# Patient Record
Sex: Male | Born: 1950 | Race: White | Hispanic: No | Smoking: Former smoker
Health system: Southern US, Community
[De-identification: ages and names within clinical notes are randomized; demographics above are authoritative.]

## PROBLEM LIST (undated history)

## (undated) DIAGNOSIS — R918 Other nonspecific abnormal finding of lung field: Secondary | ICD-10-CM

## (undated) DIAGNOSIS — R042 Hemoptysis: Secondary | ICD-10-CM

## (undated) DIAGNOSIS — R220 Localized swelling, mass and lump, head: Secondary | ICD-10-CM

## (undated) DIAGNOSIS — Z7189 Other specified counseling: Secondary | ICD-10-CM

## (undated) DIAGNOSIS — L299 Pruritus, unspecified: Secondary | ICD-10-CM

## (undated) DIAGNOSIS — Z5112 Encounter for antineoplastic immunotherapy: Secondary | ICD-10-CM

## (undated) DIAGNOSIS — L0390 Cellulitis, unspecified: Secondary | ICD-10-CM

## (undated) DIAGNOSIS — R0600 Dyspnea, unspecified: Secondary | ICD-10-CM

## (undated) DIAGNOSIS — C099 Malignant neoplasm of tonsil, unspecified: Secondary | ICD-10-CM

## (undated) HISTORY — DX: Hemoptysis: R04.2

## (undated) HISTORY — DX: Encounter for antineoplastic immunotherapy: Z51.12

## (undated) HISTORY — DX: Other nonspecific abnormal finding of lung field: R91.8

## (undated) HISTORY — PX: COLON RESECTION: SHX5231

## (undated) HISTORY — DX: Malignant neoplasm of tonsil, unspecified: C09.9

## (undated) HISTORY — PX: ANKLE SURGERY: SHX546

## (undated) HISTORY — DX: Other specified counseling: Z71.89

## (undated) HISTORY — DX: Pruritus, unspecified: L29.9

## (undated) HISTORY — DX: Localized swelling, mass and lump, head: R22.0

---

## 2013-01-23 ENCOUNTER — Emergency Department (HOSPITAL_COMMUNITY)
Admission: EM | Admit: 2013-01-23 | Discharge: 2013-01-23 | Disposition: A | Discharge status: Home / Self Care | Payer: Self-pay | Attending: Emergency Medicine | Admitting: Emergency Medicine

## 2013-01-23 ENCOUNTER — Emergency Department (HOSPITAL_COMMUNITY): Payer: Self-pay

## 2013-01-23 ENCOUNTER — Encounter (HOSPITAL_COMMUNITY): Payer: Self-pay | Admitting: Family Medicine

## 2013-01-23 DIAGNOSIS — R0602 Shortness of breath: Secondary | ICD-10-CM | POA: Insufficient documentation

## 2013-01-23 DIAGNOSIS — R059 Cough, unspecified: Secondary | ICD-10-CM | POA: Insufficient documentation

## 2013-01-23 DIAGNOSIS — R110 Nausea: Secondary | ICD-10-CM | POA: Insufficient documentation

## 2013-01-23 DIAGNOSIS — F17200 Nicotine dependence, unspecified, uncomplicated: Secondary | ICD-10-CM | POA: Insufficient documentation

## 2013-01-23 DIAGNOSIS — R05 Cough: Secondary | ICD-10-CM | POA: Insufficient documentation

## 2013-01-23 DIAGNOSIS — Z7982 Long term (current) use of aspirin: Secondary | ICD-10-CM | POA: Insufficient documentation

## 2013-01-23 DIAGNOSIS — J40 Bronchitis, not specified as acute or chronic: Secondary | ICD-10-CM

## 2013-01-23 DIAGNOSIS — J209 Acute bronchitis, unspecified: Secondary | ICD-10-CM | POA: Insufficient documentation

## 2013-01-23 DIAGNOSIS — R509 Fever, unspecified: Secondary | ICD-10-CM | POA: Insufficient documentation

## 2013-01-23 DIAGNOSIS — R21 Rash and other nonspecific skin eruption: Secondary | ICD-10-CM | POA: Insufficient documentation

## 2013-01-23 DIAGNOSIS — R062 Wheezing: Secondary | ICD-10-CM | POA: Insufficient documentation

## 2013-01-23 MED ORDER — IPRATROPIUM BROMIDE 0.02 % IN SOLN
0.5000 mg | Freq: Once | RESPIRATORY_TRACT | Status: AC
Start: 2013-01-23 — End: 2013-01-23
  Administered 2013-01-23: 0.5 mg via RESPIRATORY_TRACT
  Filled 2013-01-23: qty 2.5

## 2013-01-23 MED ORDER — ALBUTEROL SULFATE (5 MG/ML) 0.5% IN NEBU
2.5000 mg | INHALATION_SOLUTION | Freq: Once | RESPIRATORY_TRACT | Status: AC
  Administered 2013-01-23: 2.5 mg via RESPIRATORY_TRACT
  Filled 2013-01-23: qty 0.5

## 2013-01-23 MED ORDER — BENZONATATE 100 MG PO CAPS: 100.0000 mg | ORAL_CAPSULE | Freq: Three times a day (TID) | ORAL | Status: DC

## 2013-01-23 MED ORDER — AZITHROMYCIN 250 MG PO TABS: ORAL_TABLET | ORAL | Status: DC

## 2013-01-23 MED ORDER — ALBUTEROL SULFATE HFA 108 (90 BASE) MCG/ACT IN AERS
2.0000 | INHALATION_SPRAY | Freq: Once | RESPIRATORY_TRACT | Status: AC
  Administered 2013-01-23: 2 via RESPIRATORY_TRACT
  Filled 2013-01-23: qty 6.7

## 2013-01-23 NOTE — ED Provider Notes (Signed)
History     CSN: [id number]  Arrival date & time 01/23/13  2132   First MD Initiated Contact with Patient 01/23/13 2204      Chief Complaint  Patient presents with  . Cough  . Shortness of Breath    (Consider location/radiation/quality/duration/timing/severity/associated sxs/prior treatment) HPI Comments: 62 y/o male with no significant PMHx presents to the ED with his wife and mother complaining of cough, wheezing and shortness of breath x3 days. Symptoms have been worsening and cough is productive with green phlegm. Admits to associated fever of 99.1 today which he took two aspirin for. A rash developed on both arms yesterday, he applied cortisone cream and took benadryl and has since been subsiding. Tried OTC cough syrup without relief of cough. Denies sick contacts. Admits to nausea when he has a severe "coughing spell". Denies chest pain, vomiting, urinary changes, confusion. He is a 1 ppd smoker.  Patient is a 63 y.o. male presenting with cough and shortness of breath. The history is provided by the patient, a relative and the spouse.  Cough Associated symptoms: fever, rash, shortness of breath and wheezing   Associated symptoms: no chest pain and no chills   Shortness of Breath Associated symptoms: cough, fever, rash and wheezing   Associated symptoms: no abdominal pain, no chest pain, no neck pain and no vomiting     History reviewed. No pertinent past medical history.  Past Surgical History  Procedure Laterality Date  . Abdominal surgery    . Ankle surgery      No family history on file.  History  Substance Use Topics  . Smoking status: Current Every Day Smoker -- 1.00 packs/day    Types: Cigarettes  . Smokeless tobacco: Not on file  . Alcohol Use: Yes     Comment: Occasionally      Review of Systems  Constitutional: Positive for fever. Negative for chills.  HENT: Negative for neck pain and neck stiffness.   Respiratory: Positive for cough, shortness of  breath and wheezing.   Cardiovascular: Negative for chest pain.  Gastrointestinal: Positive for nausea. Negative for vomiting and abdominal pain.  Skin: Positive for rash.  Psychiatric/Behavioral: Negative for confusion.  All other systems reviewed and are negative.    Allergies  Review of patient's allergies indicates no known allergies.  Home Medications  No current outpatient prescriptions on file.  BP 141/79  Pulse 76  Temp(Src) 98.1 F (36.7 C) (Oral)  Resp 16  Ht 6' (1.829 m)  Wt 190 lb (86.183 kg)  BMI 25.76 kg/m2  SpO2 96%  Physical Exam  Nursing note and vitals reviewed. Constitutional: He is oriented to person, place, and time. He appears well-developed and well-nourished. No distress.  Eyes: Conjunctivae and EOM are normal. Pupils are equal, round, and reactive to light.  Neck: Normal range of motion. Neck supple.  Cardiovascular: Normal rate, regular rhythm, normal heart sounds and intact distal pulses.   Pulmonary/Chest: Effort normal. No respiratory distress. He has wheezes (scattered expiratory). He has rhonchi (scattered, more prominent left lung fields).  Abdominal: Soft. Bowel sounds are normal. There is no tenderness.  Musculoskeletal: Normal range of motion. He exhibits no edema.  Lymphadenopathy:    He has no cervical adenopathy.  Neurological: He is alert and oriented to person, place, and time.  Skin: Rash noted. Rash is urticarial (scattered mildly on bilateral forearms). He is not diaphoretic.  Psychiatric: He has a normal mood and affect. His behavior is normal.    ED Course  Procedures (including critical care time)  Labs Reviewed - No data to display Dg Chest 2 View  01/23/2013  *RADIOLOGY REPORT*  Clinical Data: Cough, shortness of breath  CHEST - 2 VIEW  Comparison: None.  Findings: Cardiomediastinal silhouette appears normal.  No acute pulmonary disease is noted.  Bony thorax is intact.  IMPRESSION: No acute cardiopulmonary abnormality seen.    Original Report Authenticated By: Lupita Raider.,  M.D.      1. Bronchitis   2. Cough       MDM  62 y/o male with bronchitis. CXR without any acute process. No pneumonia. Slight improvement in symptoms with duonebs. Vitals stable. Afebrile. Will discharge with azithromycin, albuterol inhaler, tessalon. Return precautions discussed. Patient states understanding of plan and is agreeable. Resource guide given for PCP follow up.        Trevor Mace, PA-C 01/23/13 2323

## 2013-01-23 NOTE — ED Notes (Addendum)
Patient states that he has had cough and shortness of breath since Friday. States symptoms have gotten worse. Has been using OTC ASA, Benadryl, and cough medicine. Has been running a low grade fever at home. Reports coughing up green sputum.

## 2013-01-23 NOTE — ED Provider Notes (Signed)
Medical screening examination/treatment/procedure(s) were performed by non-physician practitioner and as supervising physician I was immediately available for consultation/collaboration.   Nelia Shi, MD 01/23/13 [phone]

## 2016-11-28 ENCOUNTER — Emergency Department (HOSPITAL_COMMUNITY): Payer: PPO

## 2016-11-28 ENCOUNTER — Encounter (HOSPITAL_COMMUNITY): Payer: Self-pay | Admitting: Oncology

## 2016-11-28 ENCOUNTER — Emergency Department (HOSPITAL_COMMUNITY)
Admission: EM | Admit: 2016-11-28 | Discharge: 2016-11-29 | Disposition: A | Discharge status: Home / Self Care | Payer: PPO | Attending: Emergency Medicine | Admitting: Emergency Medicine

## 2016-11-28 DIAGNOSIS — R079 Chest pain, unspecified: Secondary | ICD-10-CM | POA: Insufficient documentation

## 2016-11-28 DIAGNOSIS — J90 Pleural effusion, not elsewhere classified: Secondary | ICD-10-CM | POA: Diagnosis not present

## 2016-11-28 DIAGNOSIS — Z7982 Long term (current) use of aspirin: Secondary | ICD-10-CM | POA: Insufficient documentation

## 2016-11-28 DIAGNOSIS — Z5321 Procedure and treatment not carried out due to patient leaving prior to being seen by health care provider: Secondary | ICD-10-CM | POA: Insufficient documentation

## 2016-11-28 DIAGNOSIS — F17210 Nicotine dependence, cigarettes, uncomplicated: Secondary | ICD-10-CM | POA: Diagnosis not present

## 2016-11-28 LAB — CBC
HEMATOCRIT: 37.3 % — AB (ref 39.0–52.0)
HEMOGLOBIN: 12.5 g/dL — AB (ref 13.0–17.0)
MCH: 29.6 pg (ref 26.0–34.0)
MCHC: 33.5 g/dL (ref 30.0–36.0)
MCV: 88.2 fL (ref 78.0–100.0)
Platelets: 331 10*3/uL (ref 150–400)
RBC: 4.23 MIL/uL (ref 4.22–5.81)
RDW: 13.2 % (ref 11.5–15.5)
WBC: 12.4 10*3/uL — AB (ref 4.0–10.5)

## 2016-11-28 LAB — BASIC METABOLIC PANEL
ANION GAP: 8 (ref 5–15)
BUN: 14 mg/dL (ref 6–20)
CALCIUM: 8.9 mg/dL (ref 8.9–10.3)
CO2: 26 mmol/L (ref 22–32)
Chloride: 101 mmol/L (ref 101–111)
Creatinine, Ser: 1.12 mg/dL (ref 0.61–1.24)
GFR calc Af Amer: >60 mL/min (ref >60)
Glucose, Bld: 105 mg/dL — ABNORMAL HIGH (ref 65–99)
POTASSIUM: 4.1 mmol/L (ref 3.5–5.1)
SODIUM: 135 mmol/L (ref 135–145)

## 2016-11-28 LAB — I-STAT TROPONIN, ED: TROPONIN I, POC: 0 ng/mL (ref 0.00–0.08)

## 2016-11-28 NOTE — ED Notes (Signed)
Called for room w/o answer.  Will call again.

## 2016-11-28 NOTE — ED Triage Notes (Signed)
Per pt he developed left arm pain x 2 days ago.  Today pt developed left sided chest pain that he rates 8/10.  Pt also reports that he has been waking up in the middle of the night w/ palpitations.  +lightheaded.

## 2016-11-29 NOTE — ED Notes (Signed)
No answer for blood draw @ this time. 

## 2016-11-29 NOTE — ED Notes (Signed)
Pt is not in lobby.  Will d/c LWBS.

## 2016-12-15 ENCOUNTER — Telehealth: Payer: Self-pay | Admitting: Family

## 2016-12-15 NOTE — Telephone Encounter (Signed)
If he is coughing up blood then he needs to be seen in the ED. Per review of his chart, he went to the ED but as never evaluated.

## 2016-12-15 NOTE — Telephone Encounter (Signed)
Spoke with Marzetta Board, she is aware of Greg response. Go to urgent care symptoms worse but we are completely book for new pt. Offer an appt with Baldo Ash for new pt tomorrow.

## 2016-12-15 NOTE — Telephone Encounter (Signed)
Pt's spouse called request to be work in sooner for new pt due to coughing blood. Pt went to the ED already about this but hopping to get in before 12/24/2016. Please advise.

## 2016-12-16 DIAGNOSIS — E559 Vitamin D deficiency, unspecified: Secondary | ICD-10-CM | POA: Diagnosis not present

## 2016-12-16 DIAGNOSIS — R079 Chest pain, unspecified: Secondary | ICD-10-CM | POA: Diagnosis not present

## 2016-12-16 DIAGNOSIS — Z1389 Encounter for screening for other disorder: Secondary | ICD-10-CM | POA: Diagnosis not present

## 2016-12-16 DIAGNOSIS — R918 Other nonspecific abnormal finding of lung field: Secondary | ICD-10-CM | POA: Diagnosis not present

## 2016-12-16 DIAGNOSIS — Z114 Encounter for screening for human immunodeficiency virus [HIV]: Secondary | ICD-10-CM | POA: Diagnosis not present

## 2016-12-16 DIAGNOSIS — R0602 Shortness of breath: Secondary | ICD-10-CM | POA: Diagnosis not present

## 2016-12-16 DIAGNOSIS — R221 Localized swelling, mass and lump, neck: Secondary | ICD-10-CM | POA: Diagnosis not present

## 2016-12-16 DIAGNOSIS — Z0000 Encounter for general adult medical examination without abnormal findings: Secondary | ICD-10-CM | POA: Diagnosis not present

## 2016-12-16 DIAGNOSIS — Z13220 Encounter for screening for lipoid disorders: Secondary | ICD-10-CM | POA: Diagnosis not present

## 2016-12-16 DIAGNOSIS — R5383 Other fatigue: Secondary | ICD-10-CM | POA: Diagnosis not present

## 2016-12-16 DIAGNOSIS — Z23 Encounter for immunization: Secondary | ICD-10-CM | POA: Diagnosis not present

## 2016-12-17 DIAGNOSIS — R079 Chest pain, unspecified: Secondary | ICD-10-CM | POA: Diagnosis not present

## 2016-12-22 DIAGNOSIS — R0602 Shortness of breath: Secondary | ICD-10-CM | POA: Diagnosis not present

## 2016-12-22 DIAGNOSIS — M79606 Pain in leg, unspecified: Secondary | ICD-10-CM | POA: Diagnosis not present

## 2016-12-22 DIAGNOSIS — R0989 Other specified symptoms and signs involving the circulatory and respiratory systems: Secondary | ICD-10-CM | POA: Diagnosis not present

## 2016-12-22 DIAGNOSIS — R079 Chest pain, unspecified: Secondary | ICD-10-CM | POA: Diagnosis not present

## 2016-12-23 ENCOUNTER — Ambulatory Visit: Payer: PPO | Admitting: Family

## 2016-12-24 ENCOUNTER — Ambulatory Visit: Payer: PPO | Admitting: Family

## 2017-01-01 DIAGNOSIS — R918 Other nonspecific abnormal finding of lung field: Secondary | ICD-10-CM | POA: Diagnosis not present

## 2017-01-01 DIAGNOSIS — Z87891 Personal history of nicotine dependence: Secondary | ICD-10-CM | POA: Diagnosis not present

## 2017-01-01 DIAGNOSIS — R042 Hemoptysis: Secondary | ICD-10-CM | POA: Diagnosis not present

## 2017-01-02 ENCOUNTER — Other Ambulatory Visit: Payer: Self-pay | Admitting: Family

## 2017-01-02 DIAGNOSIS — Z87891 Personal history of nicotine dependence: Secondary | ICD-10-CM

## 2017-01-02 DIAGNOSIS — R918 Other nonspecific abnormal finding of lung field: Secondary | ICD-10-CM

## 2017-01-02 DIAGNOSIS — R042 Hemoptysis: Secondary | ICD-10-CM

## 2017-01-06 ENCOUNTER — Ambulatory Visit: Payer: PPO | Admitting: Family Medicine

## 2017-01-06 DIAGNOSIS — R918 Other nonspecific abnormal finding of lung field: Secondary | ICD-10-CM | POA: Diagnosis not present

## 2017-01-06 DIAGNOSIS — R590 Localized enlarged lymph nodes: Secondary | ICD-10-CM | POA: Diagnosis not present

## 2017-01-09 ENCOUNTER — Other Ambulatory Visit: Payer: PPO

## 2017-01-12 ENCOUNTER — Telehealth: Payer: Self-pay | Admitting: *Deleted

## 2017-01-12 DIAGNOSIS — R918 Other nonspecific abnormal finding of lung field: Secondary | ICD-10-CM

## 2017-01-12 NOTE — Telephone Encounter (Signed)
Oncology Nurse Navigator Documentation  Oncology Nurse Navigator Flowsheets 01/12/2017  Navigator Location [facility]  Referral date to RadOnc/MedOnc 01/12/2017  Navigator Encounter Type Telephone/Ms. Gorder called triage here at cancer center.  That message was forward to me.  I looked up Lawrence Weaver's information and was unaware of referral.  I updated Dr. Julien Nordmann on patient.  He states he can see patient Wednesday 01/14/17 at 2pm.  I called and spoke to him and his wife regarding appt.  She was thankful for the call.   Telephone Outgoing Call  Abnormal Finding Date 11/28/2016  Treatment Phase Abnormal Scans  Barriers/Navigation Needs Coordination of Care  Interventions Coordination of Care  Coordination of Care Appts  Acuity Level 2  Acuity Level 2 Other;Assistance expediting appointments  Time Spent with Patient 30

## 2017-01-14 ENCOUNTER — Other Ambulatory Visit (HOSPITAL_BASED_OUTPATIENT_CLINIC_OR_DEPARTMENT_OTHER): Payer: PPO

## 2017-01-14 ENCOUNTER — Encounter: Payer: Self-pay | Admitting: Internal Medicine

## 2017-01-14 ENCOUNTER — Ambulatory Visit (HOSPITAL_BASED_OUTPATIENT_CLINIC_OR_DEPARTMENT_OTHER): Payer: PPO | Admitting: Internal Medicine

## 2017-01-14 ENCOUNTER — Encounter: Payer: Self-pay | Admitting: *Deleted

## 2017-01-14 VITALS — BP 121/72 | HR 88 | Temp 97.8°F | Resp 18 | Ht 72.0 in | Wt 187.4 lb

## 2017-01-14 DIAGNOSIS — R918 Other nonspecific abnormal finding of lung field: Secondary | ICD-10-CM

## 2017-01-14 DIAGNOSIS — F1011 Alcohol abuse, in remission: Secondary | ICD-10-CM

## 2017-01-14 DIAGNOSIS — Z809 Family history of malignant neoplasm, unspecified: Secondary | ICD-10-CM

## 2017-01-14 DIAGNOSIS — R911 Solitary pulmonary nodule: Secondary | ICD-10-CM

## 2017-01-14 DIAGNOSIS — Z87891 Personal history of nicotine dependence: Secondary | ICD-10-CM

## 2017-01-14 DIAGNOSIS — R599 Enlarged lymph nodes, unspecified: Secondary | ICD-10-CM

## 2017-01-14 LAB — CBC WITH DIFFERENTIAL/PLATELET
BASO%: 1.2 % (ref 0.0–2.0)
BASOS ABS: 0.2 10*3/uL — AB (ref 0.0–0.1)
EOS%: 2.3 % (ref 0.0–7.0)
Eosinophils Absolute: 0.4 10*3/uL (ref 0.0–0.5)
HCT: 37.9 % — ABNORMAL LOW (ref 38.4–49.9)
HEMOGLOBIN: 12.3 g/dL — AB (ref 13.0–17.1)
LYMPH%: 13 % — ABNORMAL LOW (ref 14.0–49.0)
MCH: 28.1 pg (ref 27.2–33.4)
MCHC: 32.3 g/dL (ref 32.0–36.0)
MCV: 86.8 fL (ref 79.3–98.0)
MONO#: 1.2 10*3/uL — ABNORMAL HIGH (ref 0.1–0.9)
MONO%: 6.5 % (ref 0.0–14.0)
NEUT#: 14.2 10*3/uL — ABNORMAL HIGH (ref 1.5–6.5)
NEUT%: 77 % — ABNORMAL HIGH (ref 39.0–75.0)
Platelets: 440 10*3/uL — ABNORMAL HIGH (ref 140–400)
RBC: 4.37 10*6/uL (ref 4.20–5.82)
RDW: 13.9 % (ref 11.0–14.6)
WBC: 18.5 10*3/uL — ABNORMAL HIGH (ref 4.0–10.3)
lymph#: 2.4 10*3/uL (ref 0.9–3.3)

## 2017-01-14 LAB — COMPREHENSIVE METABOLIC PANEL
ALBUMIN: 2.9 g/dL — AB (ref 3.5–5.0)
ALT: 21 U/L (ref 0–55)
AST: 19 U/L (ref 5–34)
Alkaline Phosphatase: 98 U/L (ref 40–150)
Anion Gap: 8 mEq/L (ref 3–11)
BUN: 16.9 mg/dL (ref 7.0–26.0)
CHLORIDE: 103 meq/L (ref 98–109)
CO2: 25 mEq/L (ref 22–29)
Calcium: 9.6 mg/dL (ref 8.4–10.4)
Creatinine: 1.1 mg/dL (ref 0.7–1.3)
EGFR: 73 mL/min/{1.73_m2} — ABNORMAL LOW (ref >90)
GLUCOSE: 101 mg/dL (ref 70–140)
Potassium: 4.5 mEq/L (ref 3.5–5.1)
SODIUM: 137 meq/L (ref 136–145)
Total Bilirubin: 0.32 mg/dL (ref 0.20–1.20)
Total Protein: 7.5 g/dL (ref 6.4–8.3)

## 2017-01-14 NOTE — Progress Notes (Signed)
Oncology Nurse Navigator Documentation  Oncology Nurse Navigator Flowsheets 01/14/2017  Navigator Location [facility]  Navigator Encounter Type Clinic/MDC/spoke with patient and wife today at [facility].  Per Dr. Julien Nordmann, I took his CD to the radiology dept to have it scanned in to PACS.  Dr. Julien Nordmann would like patient to be seen by t surgery to be evaluated for tissue DX.  I will updated Dr. Leonarda Salon office.    Patient Visit Type MedOnc  Treatment Phase Abnormal Scans  Barriers/Navigation Needs Coordination of Care  Interventions Coordination of Care  Coordination of Care Other  Acuity Level 3  Time Spent with Patient 60
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
## 2017-01-15 ENCOUNTER — Inpatient Hospital Stay
Admission: RE | Admit: 2017-01-15 | Discharge: 2017-01-15 | Disposition: A | Discharge status: Home / Self Care | Payer: Self-pay | Source: Ambulatory Visit | Attending: Internal Medicine | Admitting: Internal Medicine

## 2017-01-15 ENCOUNTER — Other Ambulatory Visit: Payer: Self-pay | Admitting: *Deleted

## 2017-01-15 ENCOUNTER — Other Ambulatory Visit: Payer: Self-pay | Admitting: Internal Medicine

## 2017-01-15 DIAGNOSIS — C801 Malignant (primary) neoplasm, unspecified: Secondary | ICD-10-CM

## 2017-01-16 ENCOUNTER — Telehealth: Payer: Self-pay | Admitting: Internal Medicine

## 2017-01-16 NOTE — Telephone Encounter (Signed)
Per 01/14/2017 los. Message sent to Norton Blizzard for f/u in [facility].

## 2017-01-19 ENCOUNTER — Institutional Professional Consult (permissible substitution) (INDEPENDENT_AMBULATORY_CARE_PROVIDER_SITE_OTHER): Payer: Medicare HMO | Admitting: Thoracic Surgery (Cardiothoracic Vascular Surgery)

## 2017-01-19 ENCOUNTER — Other Ambulatory Visit: Payer: Self-pay | Admitting: *Deleted

## 2017-01-19 ENCOUNTER — Telehealth: Payer: Self-pay | Admitting: *Deleted

## 2017-01-19 ENCOUNTER — Encounter: Payer: Self-pay | Admitting: Thoracic Surgery (Cardiothoracic Vascular Surgery)

## 2017-01-19 VITALS — BP 106/61 | HR 80 | Resp 20 | Ht 72.0 in | Wt 187.0 lb

## 2017-01-19 DIAGNOSIS — R918 Other nonspecific abnormal finding of lung field: Secondary | ICD-10-CM | POA: Diagnosis not present

## 2017-01-19 DIAGNOSIS — Z720 Tobacco use: Secondary | ICD-10-CM | POA: Diagnosis not present

## 2017-01-19 NOTE — Telephone Encounter (Signed)
Oncology Nurse Navigator Documentation  Oncology Nurse Navigator Flowsheets 01/14/2017  Navigator Location [facility]  Navigator Encounter Type Clinic/MDC/I received referral on Lawrence Weaver.  I updated Dr. Julien Nordmann. I called patient with an appt on 01/23/17 labs at 8:45 and to see Dr. Julien Nordmann at 9:15.  Patient gave wife the phone regarding appt. She verbalized understanding of appt time and place.   Patient Visit Type MedOnc  Treatment Phase Abnormal Scans  Barriers/Navigation Needs Coordination of Care  Interventions Coordination of Care  Coordination of Care Other  Acuity Level 3  Time Spent with Patient 60

## 2017-01-19 NOTE — Progress Notes (Signed)
PCP is Eloise Levels, NP Referring Provider is Curt Bears M.D.  Chief Complaint  Patient presents with  . Lung Mass    Surgical eval for Tissue BX, CT scan' in PACS 01/06/17, PET Scan and MR Brain pending 01/21/17    HPI: Lawrence Weaver is a 66 year old man with a left upper lobe mass sent for consultation.  Lawrence Weaver is a 66 year old man with a long history of tobacco abuse (2 packs per day for about 50 years). He he denies any history of COPD, hypertension, hyperlipidemia, heart disease. Back in early February he developed left-sided chest pain. He went to the emergency room 1 evening chest x-ray was done which showed a left upper lobe mass. He says it was very crowded and he wasn't getting any tension so he left the emergency room. He later saw the report of the x-ray in My chart. He went to [facility] who did a CT of the chest, which revealed a 6.5 x 8 cm mass in the left upper lobe there also was a 17 mm spiculated mass in the right upper lobe. There was some mild adenopathy.  He describes his chest pain is been localized to the left side. It is not exertional. It sometimes feels severe. It does wax and wane. It does hurt with coughing and taking a deep breath. He has had a cough Dr. both mucus and small amounts of blood. He also complains of some dizzy spells. He does get shortness of breath with exertion. His appetite is poor. He has not had any significant weight loss.  Zubrod Score: At the time of surgery this patient's most appropriate activity status/level should be described as: '[x]'$     0    Normal activity, no symptoms '[]'$     1    Restricted in physical strenuous activity but ambulatory, able to do out light work '[]'$     2    Ambulatory and capable of self care, unable to do work activities, up and about >50 % of waking hours                              '[]'$     3    Only limited self care, in bed greater than 50% of waking hours '[]'$     4    Completely disabled, no self care, confined  to bed or chair '[]'$     5    Moribund   Past Medical History:  Diagnosis Date  . Mass of lung     Past Surgical History:  Procedure Laterality Date  . ABDOMINAL SURGERY    . ANKLE SURGERY      No family history on file.  Social History Social History  Substance Use Topics  . Smoking status: Former Smoker    Packs/day: 1.00    Types: Cigarettes    Quit date: 11/16/2016  . Smokeless tobacco: Never Used  . Alcohol use Yes     Comment: Occasionally    Current Outpatient Prescriptions  Medication Sig Dispense Refill  . aspirin 325 MG tablet Take 81 mg by mouth every 6 (six) hours as needed for fever.      No current facility-administered medications for this visit.     No Known Allergies  Review of Systems  Constitutional: Positive for appetite change and fatigue. Negative for unexpected weight change.  HENT: Negative for trouble swallowing and voice change.   Eyes: Positive for visual disturbance.  Respiratory: Positive for cough and shortness of breath.   Cardiovascular: Positive for chest pain.  Gastrointestinal: Negative for abdominal pain and blood in stool.  Genitourinary: Negative for difficulty urinating and dysuria.  Musculoskeletal: Negative for arthralgias and back pain.  Neurological: Positive for dizziness. Negative for seizures and syncope.  Hematological: Negative for adenopathy. Does not bruise/bleed easily.    BP 106/61   Pulse 80   Resp 20   Ht 6' (1.829 m)   Wt 187 lb (84.8 kg)   SpO2 96% Comment: RA  BMI 25.36 kg/m  Physical Exam  Constitutional: He is oriented to person, place, and time. He appears well-developed and well-nourished. No distress.  HENT:  Head: Normocephalic and atraumatic.  Neck: Neck supple. No thyromegaly present.  Subcutaneous nodule left neck  Cardiovascular: Normal rate, regular rhythm and normal heart sounds.   Pulmonary/Chest: He has no wheezes. He has no rales.  Diminished breath sounds left upper lobe   Abdominal: Soft. He exhibits no distension. There is no tenderness.  Musculoskeletal: He exhibits no edema.  Lymphadenopathy:    He has no cervical adenopathy.  Neurological: He is alert and oriented to person, place, and time. No cranial nerve deficit.  Skin: Skin is warm and dry.  Vitals reviewed.    Diagnostic Tests: I personally reviewed the CT images from [facility].  It shows a large left upper lobe mass occupying the vast majority of the left upper lobe. There is some postobstructive pneumonitis. There is a 17 mm spiculated nodule in the right upper lobe. There are some enlarged left hilar nodes.  Impression: Lawrence Weaver is a 66 year old man with a 100-pack-year history of tobacco abuse who presents with pleuritic left-sided chest pain and shortness of breath and hemoptysis. Workup has revealed a large left upper lobe mass as well as a smaller right upper lobe nodule. This most likely is stage IV lung cancer. He needs a biopsy to definitively determine the diagnosis and guide therapy.  I recommended bronchoscopy under general anesthesia to allow adequate sampling. This would be done in the operating room on an outpatient basis. I discussed the general nature of the procedure with his wife. They understand that this endoscopic. We discussed the indications, risks, benefits, and alternatives. They understand this is diagnostic not therapeutic. They understand the risks include, but are not limited to death, MI, stroke, DVT, PE, bleeding, pneumothorax, and failure to make a diagnosis.  He wishes to proceed.  Plan: Bronchoscopy on Thursday for 5 2018.  [name], MD [facility] [phone]

## 2017-01-20 ENCOUNTER — Encounter (HOSPITAL_COMMUNITY): Payer: Self-pay | Admitting: *Deleted

## 2017-01-20 ENCOUNTER — Other Ambulatory Visit: Payer: PPO

## 2017-01-21 ENCOUNTER — Encounter (HOSPITAL_COMMUNITY): Payer: Self-pay

## 2017-01-21 ENCOUNTER — Encounter (HOSPITAL_COMMUNITY)
Admission: RE | Admit: 2017-01-21 | Discharge: 2017-01-21 | Disposition: A | Discharge status: Home / Self Care | Payer: Medicare HMO | Source: Ambulatory Visit | Attending: Internal Medicine | Admitting: Internal Medicine

## 2017-01-21 ENCOUNTER — Ambulatory Visit (HOSPITAL_COMMUNITY)
Admission: RE | Admit: 2017-01-21 | Discharge: 2017-01-21 | Disposition: A | Discharge status: Home / Self Care | Payer: Medicare HMO | Source: Ambulatory Visit | Attending: Internal Medicine | Admitting: Internal Medicine

## 2017-01-21 DIAGNOSIS — R918 Other nonspecific abnormal finding of lung field: Secondary | ICD-10-CM | POA: Diagnosis not present

## 2017-01-21 DIAGNOSIS — R930 Abnormal findings on diagnostic imaging of skull and head, not elsewhere classified: Secondary | ICD-10-CM | POA: Diagnosis not present

## 2017-01-21 DIAGNOSIS — R911 Solitary pulmonary nodule: Secondary | ICD-10-CM | POA: Diagnosis not present

## 2017-01-21 DIAGNOSIS — J189 Pneumonia, unspecified organism: Secondary | ICD-10-CM | POA: Diagnosis not present

## 2017-01-21 DIAGNOSIS — J90 Pleural effusion, not elsewhere classified: Secondary | ICD-10-CM | POA: Insufficient documentation

## 2017-01-21 DIAGNOSIS — M799 Soft tissue disorder, unspecified: Secondary | ICD-10-CM | POA: Diagnosis not present

## 2017-01-21 LAB — GLUCOSE, CAPILLARY: GLUCOSE-CAPILLARY: 102 mg/dL — AB (ref 65–99)

## 2017-01-21 MED ORDER — FLUDEOXYGLUCOSE F - 18 (FDG) INJECTION
9.7100 | Freq: Once | INTRAVENOUS | Status: AC | PRN
  Administered 2017-01-21: 9.71 via INTRAVENOUS

## 2017-01-21 MED ORDER — IOPAMIDOL (ISOVUE-300) INJECTION 61%
INTRAVENOUS | Status: AC
  Administered 2017-01-21: 75 mL
  Filled 2017-01-21: qty 75

## 2017-01-21 NOTE — Anesthesia Preprocedure Evaluation (Addendum)
Anesthesia Evaluation  Patient identified by MRN, date of birth, ID band Patient awake    Reviewed: Allergy & Precautions, NPO status , Patient's Chart, lab work & pertinent test results  Airway Mallampati: II  TM Distance: >3 FB Neck ROM: Full    Dental  (+) Dental Advisory Given, Missing   Pulmonary shortness of breath and with exertion, former smoker,  LUL mass   Pulmonary exam normal breath sounds clear to auscultation       Cardiovascular Exercise Tolerance: Good negative cardio ROS Normal cardiovascular exam Rhythm:Regular Rate:Normal     Neuro/Psych negative neurological ROS  negative psych ROS   GI/Hepatic negative GI ROS, Neg liver ROS,   Endo/Other  negative endocrine ROS  Renal/GU negative Renal ROS     Musculoskeletal negative musculoskeletal ROS (+)   Abdominal   Peds  Hematology  (+) Blood dyscrasia, anemia ,   Anesthesia Other Findings Day of surgery medications reviewed with the patient.  Reproductive/Obstetrics                           Anesthesia Physical Anesthesia Plan  ASA: III  Anesthesia Plan: General   Post-op Pain Management:    Induction: Intravenous  Airway Management Planned: Oral ETT  Additional Equipment:   Intra-op Plan:   Post-operative Plan: Extubation in OR  Informed Consent: I have reviewed the patients History and Physical, chart, labs and discussed the procedure including the risks, benefits and alternatives for the proposed anesthesia with the patient or authorized representative who has indicated his/her understanding and acceptance.   Dental advisory given  Plan Discussed with: CRNA and Anesthesiologist  Anesthesia Plan Comments: (Risks/benefits of general anesthesia discussed with patient including risk of damage to teeth, lips, gum, and tongue, nausea/vomiting, allergic reactions to medications, and the possibility of heart attack,  stroke and death.  All patient questions answered.  Patient wishes to proceed.)       Anesthesia Quick Evaluation

## 2017-01-22 ENCOUNTER — Ambulatory Visit (HOSPITAL_COMMUNITY): Payer: Medicare HMO | Admitting: Anesthesiology

## 2017-01-22 ENCOUNTER — Ambulatory Visit (HOSPITAL_COMMUNITY)
Admission: RE | Admit: 2017-01-22 | Discharge: 2017-01-22 | Disposition: A | Discharge status: Home / Self Care | Payer: Medicare HMO | Source: Ambulatory Visit | Attending: Thoracic Surgery (Cardiothoracic Vascular Surgery) | Admitting: Thoracic Surgery (Cardiothoracic Vascular Surgery)

## 2017-01-22 ENCOUNTER — Encounter (HOSPITAL_COMMUNITY)
Admission: RE | Disposition: A | Discharge status: Home / Self Care | Payer: Self-pay | Source: Ambulatory Visit | Attending: Thoracic Surgery (Cardiothoracic Vascular Surgery)

## 2017-01-22 ENCOUNTER — Ambulatory Visit (HOSPITAL_COMMUNITY): Payer: Medicare HMO

## 2017-01-22 ENCOUNTER — Encounter: Payer: PPO | Admitting: Thoracic Surgery (Cardiothoracic Vascular Surgery)

## 2017-01-22 ENCOUNTER — Encounter (HOSPITAL_COMMUNITY): Payer: Self-pay | Admitting: *Deleted

## 2017-01-22 DIAGNOSIS — C3412 Malignant neoplasm of upper lobe, left bronchus or lung: Secondary | ICD-10-CM | POA: Insufficient documentation

## 2017-01-22 DIAGNOSIS — J984 Other disorders of lung: Secondary | ICD-10-CM | POA: Diagnosis not present

## 2017-01-22 DIAGNOSIS — Z419 Encounter for procedure for purposes other than remedying health state, unspecified: Secondary | ICD-10-CM

## 2017-01-22 DIAGNOSIS — R918 Other nonspecific abnormal finding of lung field: Secondary | ICD-10-CM

## 2017-01-22 DIAGNOSIS — R0602 Shortness of breath: Secondary | ICD-10-CM | POA: Diagnosis not present

## 2017-01-22 DIAGNOSIS — Z01818 Encounter for other preprocedural examination: Secondary | ICD-10-CM

## 2017-01-22 DIAGNOSIS — Z87891 Personal history of nicotine dependence: Secondary | ICD-10-CM | POA: Diagnosis not present

## 2017-01-22 DIAGNOSIS — Z7982 Long term (current) use of aspirin: Secondary | ICD-10-CM | POA: Diagnosis not present

## 2017-01-22 HISTORY — PX: VIDEO BRONCHOSCOPY: SHX5072

## 2017-01-22 HISTORY — DX: Cellulitis, unspecified: L03.90

## 2017-01-22 HISTORY — DX: Dyspnea, unspecified: R06.00

## 2017-01-22 LAB — COMPREHENSIVE METABOLIC PANEL
ALK PHOS: 109 U/L (ref 38–126)
ALT: 34 U/L (ref 17–63)
AST: 25 U/L (ref 15–41)
Albumin: 2.7 g/dL — ABNORMAL LOW (ref 3.5–5.0)
Anion gap: 11 (ref 5–15)
BILIRUBIN TOTAL: 0.5 mg/dL (ref 0.3–1.2)
BUN: 13 mg/dL (ref 6–20)
CALCIUM: 9.3 mg/dL (ref 8.9–10.3)
CHLORIDE: 99 mmol/L — AB (ref 101–111)
CO2: 25 mmol/L (ref 22–32)
CREATININE: 0.97 mg/dL (ref 0.61–1.24)
GFR calc Af Amer: >60 mL/min (ref >60)
Glucose, Bld: 106 mg/dL — ABNORMAL HIGH (ref 65–99)
Potassium: 4 mmol/L (ref 3.5–5.1)
Sodium: 135 mmol/L (ref 135–145)
Total Protein: 7.3 g/dL (ref 6.5–8.1)

## 2017-01-22 LAB — CBC
HCT: 34.5 % — ABNORMAL LOW (ref 39.0–52.0)
HEMOGLOBIN: 11.1 g/dL — AB (ref 13.0–17.0)
MCH: 27.8 pg (ref 26.0–34.0)
MCHC: 32.2 g/dL (ref 30.0–36.0)
MCV: 86.5 fL (ref 78.0–100.0)
PLATELETS: 476 10*3/uL — AB (ref 150–400)
RBC: 3.99 MIL/uL — ABNORMAL LOW (ref 4.22–5.81)
RDW: 14.1 % (ref 11.5–15.5)
WBC: 21.3 10*3/uL — AB (ref 4.0–10.5)

## 2017-01-22 LAB — APTT: aPTT: 36 seconds (ref 24–36)

## 2017-01-22 LAB — PROTIME-INR
INR: 1.1
PROTHROMBIN TIME: 14.3 s (ref 11.4–15.2)

## 2017-01-22 SURGERY — BRONCHOSCOPY, VIDEO-ASSISTED
Anesthesia: General

## 2017-01-22 MED ORDER — ACETAMINOPHEN 325 MG PO TABS
650.0000 mg | ORAL_TABLET | Freq: Once | ORAL | Status: AC
  Administered 2017-01-22: 650 mg via ORAL

## 2017-01-22 MED ORDER — OXYCODONE HCL 5 MG PO TABS
10.0000 mg | ORAL_TABLET | Freq: Once | ORAL | Status: AC
  Administered 2017-01-22: 10 mg via ORAL

## 2017-01-22 MED ORDER — LIDOCAINE 2% (20 MG/ML) 5 ML SYRINGE
INTRAMUSCULAR | Status: AC
  Filled 2017-01-22: qty 5

## 2017-01-22 MED ORDER — FENTANYL CITRATE (PF) 100 MCG/2ML IJ SOLN
INTRAMUSCULAR | Status: DC | PRN
  Administered 2017-01-22: 50 ug via INTRAVENOUS
  Administered 2017-01-22: 100 ug via INTRAVENOUS

## 2017-01-22 MED ORDER — PROPOFOL 10 MG/ML IV BOLUS
INTRAVENOUS | Status: DC | PRN
  Administered 2017-01-22: 150 mg via INTRAVENOUS

## 2017-01-22 MED ORDER — ROCURONIUM BROMIDE 50 MG/5ML IV SOSY
PREFILLED_SYRINGE | INTRAVENOUS | Status: AC
  Filled 2017-01-22: qty 5

## 2017-01-22 MED ORDER — PHENYLEPHRINE 40 MCG/ML (10ML) SYRINGE FOR IV PUSH (FOR BLOOD PRESSURE SUPPORT)
PREFILLED_SYRINGE | INTRAVENOUS | Status: DC | PRN
  Administered 2017-01-22 (×2): 80 ug via INTRAVENOUS
  Administered 2017-01-22: 40 ug via INTRAVENOUS

## 2017-01-22 MED ORDER — PROPOFOL 10 MG/ML IV BOLUS
INTRAVENOUS | Status: AC
  Filled 2017-01-22: qty 20

## 2017-01-22 MED ORDER — ACETAMINOPHEN 325 MG PO TABS
ORAL_TABLET | ORAL | Status: AC
  Filled 2017-01-22: qty 2

## 2017-01-22 MED ORDER — EPINEPHRINE PF 1 MG/ML IJ SOLN
INTRAMUSCULAR | Status: AC
  Filled 2017-01-22: qty 1

## 2017-01-22 MED ORDER — ROCURONIUM BROMIDE 10 MG/ML (PF) SYRINGE
PREFILLED_SYRINGE | INTRAVENOUS | Status: DC | PRN
  Administered 2017-01-22: 40 mg via INTRAVENOUS

## 2017-01-22 MED ORDER — PHENYLEPHRINE 40 MCG/ML (10ML) SYRINGE FOR IV PUSH (FOR BLOOD PRESSURE SUPPORT)
PREFILLED_SYRINGE | INTRAVENOUS | Status: AC
  Filled 2017-01-22: qty 10

## 2017-01-22 MED ORDER — ONDANSETRON HCL 4 MG/2ML IJ SOLN: 4.0000 mg | Freq: Once | INTRAMUSCULAR | Status: DC | PRN

## 2017-01-22 MED ORDER — EPINEPHRINE PF 1 MG/ML IJ SOLN
INTRAMUSCULAR | Status: DC | PRN
  Administered 2017-01-22: 1 mg via ENDOTRACHEOPULMONARY

## 2017-01-22 MED ORDER — SUCCINYLCHOLINE CHLORIDE 200 MG/10ML IV SOSY
PREFILLED_SYRINGE | INTRAVENOUS | Status: AC
  Filled 2017-01-22: qty 10

## 2017-01-22 MED ORDER — FENTANYL CITRATE (PF) 100 MCG/2ML IJ SOLN: 25.0000 ug | INTRAMUSCULAR | Status: DC | PRN

## 2017-01-22 MED ORDER — ONDANSETRON HCL 4 MG/2ML IJ SOLN
INTRAMUSCULAR | Status: DC | PRN
  Administered 2017-01-22: 4 mg via INTRAVENOUS

## 2017-01-22 MED ORDER — LIDOCAINE 2% (20 MG/ML) 5 ML SYRINGE
INTRAMUSCULAR | Status: DC | PRN
  Administered 2017-01-22: 100 mg via INTRAVENOUS

## 2017-01-22 MED ORDER — SUGAMMADEX SODIUM 200 MG/2ML IV SOLN
INTRAVENOUS | Status: DC | PRN
  Administered 2017-01-22: 160 mg via INTRAVENOUS

## 2017-01-22 MED ORDER — MIDAZOLAM HCL 2 MG/2ML IJ SOLN
INTRAMUSCULAR | Status: AC
  Filled 2017-01-22: qty 2

## 2017-01-22 MED ORDER — MIDAZOLAM HCL 2 MG/2ML IJ SOLN
INTRAMUSCULAR | Status: DC | PRN
  Administered 2017-01-22: 1 mg via INTRAVENOUS

## 2017-01-22 MED ORDER — FENTANYL CITRATE (PF) 250 MCG/5ML IJ SOLN
INTRAMUSCULAR | Status: AC
Start: 2017-01-22 — End: ?
  Filled 2017-01-22: qty 5

## 2017-01-22 MED ORDER — OXYCODONE HCL 5 MG PO TABS
ORAL_TABLET | ORAL | Status: DC
Start: 2017-01-22 — End: 2017-01-22
  Filled 2017-01-22: qty 2

## 2017-01-22 MED ORDER — LACTATED RINGERS IV SOLN
INTRAVENOUS | Status: DC
  Administered 2017-01-22: 07:00:00 via INTRAVENOUS

## 2017-01-22 MED ORDER — 0.9 % SODIUM CHLORIDE (POUR BTL) OPTIME
TOPICAL | Status: DC | PRN
  Administered 2017-01-22: 1000 mL

## 2017-01-22 MED ORDER — EPHEDRINE 5 MG/ML INJ
INTRAVENOUS | Status: AC
  Filled 2017-01-22: qty 10

## 2017-01-22 SURGICAL SUPPLY — 26 items
BRUSH CYTOL CELLEBRITY 1.5X140 (MISCELLANEOUS) ×3 IMPLANT
CANISTER SUCT 3000ML PPV (MISCELLANEOUS) ×3 IMPLANT
CONT SPEC 4OZ CLIKSEAL STRL BL (MISCELLANEOUS) ×6 IMPLANT
COVER BACK TABLE 60X90IN (DRAPES) ×3 IMPLANT
FILTER STRAW FLUID ASPIR (MISCELLANEOUS) ×3 IMPLANT
FORCEPS BIOP RJ4 1.8 (CUTTING FORCEPS) IMPLANT
FORCEPS RADIAL JAW LRG 4 PULM (INSTRUMENTS) ×1 IMPLANT
GAUZE SPONGE 4X4 12PLY STRL (GAUZE/BANDAGES/DRESSINGS) ×3 IMPLANT
GLOVE SURG SIGNA 7.5 PF LTX (GLOVE) ×3 IMPLANT
GLOVE SURG SS PI 7.0 STRL IVOR (GLOVE) ×6 IMPLANT
GOWN STRL REUS W/ TWL XL LVL3 (GOWN DISPOSABLE) ×3 IMPLANT
GOWN STRL REUS W/TWL XL LVL3 (GOWN DISPOSABLE) ×6
KIT CLEAN ENDO COMPLIANCE (KITS) ×3 IMPLANT
KIT ROOM TURNOVER OR (KITS) ×3 IMPLANT
NEEDLE BIOPSY TRANSBRONCH 21G (NEEDLE) IMPLANT
NS IRRIG 1000ML POUR BTL (IV SOLUTION) ×3 IMPLANT
OIL SILICONE PENTAX (PARTS (SERVICE/REPAIRS)) ×3 IMPLANT
PAD ARMBOARD 7.5X6 YLW CONV (MISCELLANEOUS) ×6 IMPLANT
RADIAL JAW LRG 4 PULMONARY (INSTRUMENTS) ×2
SYR 20ML ECCENTRIC (SYRINGE) ×6 IMPLANT
SYR 5ML LL (SYRINGE) ×6 IMPLANT
SYR 5ML LUER SLIP (SYRINGE) ×3 IMPLANT
TOWEL OR 17X24 6PK STRL BLUE (TOWEL DISPOSABLE) ×3 IMPLANT
TRAP SPECIMEN MUCOUS 40CC (MISCELLANEOUS) ×3 IMPLANT
TUBE CONNECTING 20'X1/4 (TUBING) ×1
TUBE CONNECTING 20X1/4 (TUBING) ×2 IMPLANT

## 2017-01-22 NOTE — Interval H&P Note (Signed)
History and Physical Interval Note:  01/22/2017 7:51 AM  Lawrence Weaver  has presented today for surgery, with the diagnosis of LEFT UPPER LOBE MASS  The various methods of treatment have been discussed with the patient and family. After consideration of risks, benefits and other options for treatment, the patient has consented to  Procedure(s): VIDEO BRONCHOSCOPY WITH FLUORO (N/A) as a surgical intervention .  The patient's history has been reviewed, patient examined, no change in status, stable for surgery.  I have reviewed the patient's chart and labs.  Questions were answered to the patient's satisfaction.     [name]

## 2017-01-22 NOTE — Discharge Instructions (Signed)
Do not drive or engage in heavy physical activity for 24 hours  You may resume normal activities to tomorrow  You may cough up small amounts of blood over the next few days.  Call [phone] if you develop chest pain, shortness of breath, fever > 101 F or cough up more than 2 tablespoons of blood  My office will contact you with an appointment to be seen in the thoracic oncology clinic

## 2017-01-22 NOTE — Transfer of Care (Signed)
Immediate Anesthesia Transfer of Care Note  Patient: Lawrence Weaver  Procedure(s) Performed: Procedure(s): VIDEO BRONCHOSCOPY WITH FLUORO (N/A)  Patient Location: PACU  Anesthesia Type:General  Level of Consciousness: awake, alert , oriented and patient cooperative  Airway & Oxygen Therapy: Patient Spontanous Breathing and Patient connected to nasal cannula oxygen  Post-op Assessment: Report given to RN, Post -op Vital signs reviewed and stable and Patient moving all extremities X 4  Post vital signs: Reviewed and stable  Last Vitals:  Vitals:   01/22/17 0559  BP: 133/67  Pulse: (!) 3  Resp: 20  Temp: 37 C    Last Pain:  Vitals:   01/22/17 0559  TempSrc: Oral      Patients Stated Pain Goal: 2 (43/83/77 9396)  Complications: No apparent anesthesia complications

## 2017-01-22 NOTE — Progress Notes (Signed)
Dr. Roxan Hockey @ bedside to speak with pt post-op

## 2017-01-22 NOTE — Anesthesia Procedure Notes (Signed)
Procedure Name: Intubation Date/Time: 01/22/2017 8:13 AM Performed by: Julieta Bellini Pre-anesthesia Checklist: Patient identified, Emergency Drugs available, Suction available and Patient being monitored Patient Re-evaluated:Patient Re-evaluated prior to inductionOxygen Delivery Method: Circle system utilized Preoxygenation: Pre-oxygenation with 100% oxygen Intubation Type: IV induction Ventilation: Mask ventilation without difficulty Laryngoscope Size: Mac and 4 Grade View: Grade II Tube type: Oral Tube size: 8.5 mm Number of attempts: 1 Airway Equipment and Method: Stylet Placement Confirmation: ETT inserted through vocal cords under direct vision,  positive ETCO2 and breath sounds checked- equal and bilateral Secured at: 25 cm Tube secured with: Tape Dental Injury: Teeth and Oropharynx as per pre-operative assessment  Comments: CRNA attempted DL with a grade 3 view Mac 4 blade, asked Dr. Gifford Shave if he would attempt to possibly maintain a better view. Dr. Gifford Shave intubation grade 2 view with Mac 4 blade x1 attempt

## 2017-01-22 NOTE — Op Note (Signed)
NAMEMIRACLE, CRIADO                  ACCOUNT NO.:  [id number]  MEDICAL RECORD NO.:  [id number]  LOCATION:                                 FACILITY:  PHYSICIAN:  Revonda Standard. Roxan Hockey, M.D.DATE OF BIRTH:  November 03, 1950  DATE OF PROCEDURE:  01/22/2017 DATE OF DISCHARGE:                              OPERATIVE REPORT   PREOPERATIVE DIAGNOSIS:  Left upper lobe mass.  POSTOPERATIVE DIAGNOSIS:  Non-small cell carcinoma, clinical stage IV.  PROCEDURE:  Video bronchoscopy with brushings and biopsies and fluoroscopy.  SURGEON:  Revonda Standard. Roxan Hockey, M.D.  ANESTHESIA:  General.  FINDINGS:  Endobronchial lesion seen at the origin of the anterior- apical- posterior segmental bronchus of the left upper lobe. Brushings revealed non-small cell carcinoma. Mild bleeding with biopsies.  CLINICAL NOTE:  Lawrence Weaver is a 66 year old gentleman with a history of tobacco abuse who presented with left-sided chest pain.  He went to the emergency room, and a chest x-ray showed a left upper lobe mass.  He left without any further followup.  He went to [facility], who did a CT of the chest. It revealed an 8-cm mass in the left upper lobe and a 17-mm spiculated mass in the right upper lobe.  There was mild adenopathy.  He was advised to undergo bronchoscopy for diagnostic purposes.  The indications, risks, benefits, and alternatives were discussed in detail with the patient.  He understood and accepted the risks and agreed to proceed.  OPERATIVE NOTE:  [name] was brought to the operating room on January 22, 2017.  He had induction of general anesthesia and was intubated. Flexible fiberoptic bronchoscopy was performed via the endotracheal tube.  The trachea and right bronchial tree were normal to the level of subsegmental bronchi.  The left mainstem bronchus was normal as was the left lower lobe bronchus to the level of the subsegmental bronchi.  In the left upper lobe bronchus, the lingular bronchus was  normal, but the other segmental bronchus had obvious tumor.  Brushings were performed and sent for quick prep.  There was minimal bleeding with brushings. Fluoroscopy was used during the sampling.  Next, biopsies were obtained. There was some mild bleeding with biopsies, and saline was used to clear the blood. Dilute epinephrine was applied topically to help with the bleeding.  Multiple large biopsies were obtained.  The quick prep on the brushings returned showing non-small cell carcinoma.  The biopsies will be sent for permanent pathology.  Final inspection was made with the bronchoscope, and there was no significant ongoing bleeding.  The bronchoscope was removed.  The patient was extubated in the operating room and taken to the postanesthetic care unit in good condition.     Revonda Standard Roxan Hockey, M.D.     SCH/MEDQ  D:  01/22/2017  T:  01/22/2017  Job:  [id number]

## 2017-01-22 NOTE — H&P (View-Only) (Signed)
PCP is Eloise Levels, NP Referring Provider is Curt Bears M.D.  Chief Complaint  Patient presents with  . Lung Mass    Surgical eval for Tissue BX, CT scan' in PACS 01/06/17, PET Scan and MR Brain pending 01/21/17    HPI: Mr. Stelle is a 66 year old man with a left upper lobe mass sent for consultation.  Mr. Tangen is a 66 year old man with a long history of tobacco abuse (2 packs per day for about 50 years). He he denies any history of COPD, hypertension, hyperlipidemia, heart disease. Back in early February he developed left-sided chest pain. He went to the emergency room 1 evening chest x-ray was done which showed a left upper lobe mass. He says it was very crowded and he wasn't getting any tension so he left the emergency room. He later saw the report of the x-ray in My chart. He went to [facility] who did a CT of the chest, which revealed a 6.5 x 8 cm mass in the left upper lobe there also was a 17 mm spiculated mass in the right upper lobe. There was some mild adenopathy.  He describes his chest pain is been localized to the left side. It is not exertional. It sometimes feels severe. It does wax and wane. It does hurt with coughing and taking a deep breath. He has had a cough Dr. both mucus and small amounts of blood. He also complains of some dizzy spells. He does get shortness of breath with exertion. His appetite is poor. He has not had any significant weight loss.  Zubrod Score: At the time of surgery this patient's most appropriate activity status/level should be described as: '[x]'$     0    Normal activity, no symptoms '[]'$     1    Restricted in physical strenuous activity but ambulatory, able to do out light work '[]'$     2    Ambulatory and capable of self care, unable to do work activities, up and about >50 % of waking hours                              '[]'$     3    Only limited self care, in bed greater than 50% of waking hours '[]'$     4    Completely disabled, no self care, confined  to bed or chair '[]'$     5    Moribund   Past Medical History:  Diagnosis Date  . Mass of lung     Past Surgical History:  Procedure Laterality Date  . ABDOMINAL SURGERY    . ANKLE SURGERY      No family history on file.  Social History Social History  Substance Use Topics  . Smoking status: Former Smoker    Packs/day: 1.00    Types: Cigarettes    Quit date: 11/16/2016  . Smokeless tobacco: Never Used  . Alcohol use Yes     Comment: Occasionally    Current Outpatient Prescriptions  Medication Sig Dispense Refill  . aspirin 325 MG tablet Take 81 mg by mouth every 6 (six) hours as needed for fever.      No current facility-administered medications for this visit.     No Known Allergies  Review of Systems  Constitutional: Positive for appetite change and fatigue. Negative for unexpected weight change.  HENT: Negative for trouble swallowing and voice change.   Eyes: Positive for visual disturbance.  Respiratory: Positive for cough and shortness of breath.   Cardiovascular: Positive for chest pain.  Gastrointestinal: Negative for abdominal pain and blood in stool.  Genitourinary: Negative for difficulty urinating and dysuria.  Musculoskeletal: Negative for arthralgias and back pain.  Neurological: Positive for dizziness. Negative for seizures and syncope.  Hematological: Negative for adenopathy. Does not bruise/bleed easily.    BP 106/61   Pulse 80   Resp 20   Ht 6' (1.829 m)   Wt 187 lb (84.8 kg)   SpO2 96% Comment: RA  BMI 25.36 kg/m  Physical Exam  Constitutional: He is oriented to person, place, and time. He appears well-developed and well-nourished. No distress.  HENT:  Head: Normocephalic and atraumatic.  Neck: Neck supple. No thyromegaly present.  Subcutaneous nodule left neck  Cardiovascular: Normal rate, regular rhythm and normal heart sounds.   Pulmonary/Chest: He has no wheezes. He has no rales.  Diminished breath sounds left upper lobe   Abdominal: Soft. He exhibits no distension. There is no tenderness.  Musculoskeletal: He exhibits no edema.  Lymphadenopathy:    He has no cervical adenopathy.  Neurological: He is alert and oriented to person, place, and time. No cranial nerve deficit.  Skin: Skin is warm and dry.  Vitals reviewed.    Diagnostic Tests: I personally reviewed the CT images from [facility].  It shows a large left upper lobe mass occupying the vast majority of the left upper lobe. There is some postobstructive pneumonitis. There is a 17 mm spiculated nodule in the right upper lobe. There are some enlarged left hilar nodes.  Impression: Mr. Shomaker is a 66 year old man with a 100-pack-year history of tobacco abuse who presents with pleuritic left-sided chest pain and shortness of breath and hemoptysis. Workup has revealed a large left upper lobe mass as well as a smaller right upper lobe nodule. This most likely is stage IV lung cancer. He needs a biopsy to definitively determine the diagnosis and guide therapy.  I recommended bronchoscopy under general anesthesia to allow adequate sampling. This would be done in the operating room on an outpatient basis. I discussed the general nature of the procedure with his wife. They understand that this endoscopic. We discussed the indications, risks, benefits, and alternatives. They understand this is diagnostic not therapeutic. They understand the risks include, but are not limited to death, MI, stroke, DVT, PE, bleeding, pneumothorax, and failure to make a diagnosis.  He wishes to proceed.  Plan: Bronchoscopy on Thursday for 5 2018.  [name], MD [facility] [phone]

## 2017-01-22 NOTE — Brief Op Note (Signed)
01/22/2017  9:08 AM  PATIENT:  Lawrence Weaver  66 y.o. male  PRE-OPERATIVE DIAGNOSIS:  LEFT UPPER LOBE MASS  POST-OPERATIVE DIAGNOSIS:  LEFT UPPER LOBE MASS- NON-SMALL CELL CARCINOMA  PROCEDURE:  Procedure(s): VIDEO BRONCHOSCOPY WITH FLUORO (N/A) with brushings and biopsies  SURGEON:  Surgeon(s) and Role:    * [name], MD - Primary  ANESTHESIA:   general  EBL:  Total I/O In: 450 [I.V.:450] Out: 5 [Blood:5]  BLOOD ADMINISTERED:none  DRAINS: none   LOCAL MEDICATIONS USED:  NONE  SPECIMEN:  Source of Specimen:  LUL mass  DISPOSITION OF SPECIMEN:  PATHOLOGY  PLAN OF CARE: Discharge to home after PACU  PATIENT DISPOSITION:  PACU - hemodynamically stable.   Delay start of Pharmacological VTE agent (>24hrs) due to surgical blood loss or risk of bleeding: not applicable

## 2017-01-22 NOTE — Anesthesia Postprocedure Evaluation (Signed)
Anesthesia Post Note  Patient: Lawrence Weaver  Procedure(s) Performed: Procedure(s) (LRB): VIDEO BRONCHOSCOPY WITH FLUORO (N/A)  Patient location during evaluation: PACU Anesthesia Type: General Level of consciousness: awake and alert Pain management: pain level controlled Vital Signs Assessment: post-procedure vital signs reviewed and stable Respiratory status: spontaneous breathing, nonlabored ventilation, respiratory function stable and patient connected to nasal cannula oxygen Cardiovascular status: blood pressure returned to baseline and stable Postop Assessment: no signs of nausea or vomiting Anesthetic complications: no       Last Vitals:  Vitals:   01/22/17 0945 01/22/17 1000  BP: 109/60 103/66  Pulse: 89 88  Resp: (!) 24 (!) 22  Temp:  36.7 C    Last Pain:  Vitals:   01/22/17 1000  TempSrc:   PainSc: 0-No pain                 Catalina Gravel

## 2017-01-23 ENCOUNTER — Telehealth: Payer: Self-pay | Admitting: Internal Medicine

## 2017-01-23 ENCOUNTER — Encounter (HOSPITAL_COMMUNITY): Payer: Self-pay | Admitting: Thoracic Surgery (Cardiothoracic Vascular Surgery)

## 2017-01-23 ENCOUNTER — Ambulatory Visit (HOSPITAL_BASED_OUTPATIENT_CLINIC_OR_DEPARTMENT_OTHER): Payer: Medicare HMO | Admitting: Internal Medicine

## 2017-01-23 ENCOUNTER — Encounter: Payer: Self-pay | Admitting: *Deleted

## 2017-01-23 ENCOUNTER — Other Ambulatory Visit (HOSPITAL_BASED_OUTPATIENT_CLINIC_OR_DEPARTMENT_OTHER): Payer: Medicare HMO

## 2017-01-23 VITALS — BP 131/62 | HR 103 | Temp 97.7°F | Resp 18 | Ht 72.0 in | Wt 184.9 lb

## 2017-01-23 DIAGNOSIS — R599 Enlarged lymph nodes, unspecified: Secondary | ICD-10-CM | POA: Diagnosis not present

## 2017-01-23 DIAGNOSIS — R918 Other nonspecific abnormal finding of lung field: Secondary | ICD-10-CM

## 2017-01-23 DIAGNOSIS — R220 Localized swelling, mass and lump, head: Secondary | ICD-10-CM

## 2017-01-23 DIAGNOSIS — R911 Solitary pulmonary nodule: Secondary | ICD-10-CM | POA: Diagnosis not present

## 2017-01-23 DIAGNOSIS — Z720 Tobacco use: Secondary | ICD-10-CM

## 2017-01-23 DIAGNOSIS — J358 Other chronic diseases of tonsils and adenoids: Secondary | ICD-10-CM

## 2017-01-23 HISTORY — DX: Other chronic diseases of tonsils and adenoids: J35.8

## 2017-01-23 LAB — CBC WITH DIFFERENTIAL/PLATELET
BASO%: 1 % (ref 0.0–2.0)
Basophils Absolute: 0.2 10*3/uL — ABNORMAL HIGH (ref 0.0–0.1)
EOS%: 1.7 % (ref 0.0–7.0)
Eosinophils Absolute: 0.3 10*3/uL (ref 0.0–0.5)
HCT: 35.1 % — ABNORMAL LOW (ref 38.4–49.9)
HGB: 11.5 g/dL — ABNORMAL LOW (ref 13.0–17.1)
LYMPH%: 7.5 % — AB (ref 14.0–49.0)
MCH: 28.1 pg (ref 27.2–33.4)
MCHC: 32.8 g/dL (ref 32.0–36.0)
MCV: 85.8 fL (ref 79.3–98.0)
MONO#: 1.1 10*3/uL — AB (ref 0.1–0.9)
MONO%: 5.9 % (ref 0.0–14.0)
NEUT#: 16 10*3/uL — ABNORMAL HIGH (ref 1.5–6.5)
NEUT%: 83.9 % — AB (ref 39.0–75.0)
PLATELETS: 439 10*3/uL — AB (ref 140–400)
RBC: 4.1 10*6/uL — AB (ref 4.20–5.82)
RDW: 14 % (ref 11.0–14.6)
WBC: 19.1 10*3/uL — AB (ref 4.0–10.3)
lymph#: 1.4 10*3/uL (ref 0.9–3.3)

## 2017-01-23 LAB — COMPREHENSIVE METABOLIC PANEL
ALT: 35 U/L (ref 0–55)
AST: 30 U/L (ref 5–34)
Albumin: 2.6 g/dL — ABNORMAL LOW (ref 3.5–5.0)
Alkaline Phosphatase: 128 U/L (ref 40–150)
Anion Gap: 9 mEq/L (ref 3–11)
BUN: 14.7 mg/dL (ref 7.0–26.0)
CALCIUM: 10.3 mg/dL (ref 8.4–10.4)
CHLORIDE: 103 meq/L (ref 98–109)
CO2: 26 meq/L (ref 22–29)
CREATININE: 1 mg/dL (ref 0.7–1.3)
EGFR: 78 mL/min/{1.73_m2} — ABNORMAL LOW (ref >90)
GLUCOSE: 128 mg/dL (ref 70–140)
POTASSIUM: 5 meq/L (ref 3.5–5.1)
SODIUM: 138 meq/L (ref 136–145)
Total Bilirubin: 0.47 mg/dL (ref 0.20–1.20)
Total Protein: 7.4 g/dL (ref 6.4–8.3)

## 2017-01-23 NOTE — Telephone Encounter (Signed)
Appointments scheduled per 4.5.18 LOS. Patient given AVS report and calendars with future scheduled appointments.  Called Ears[facility] Nose, and Throat to set up appointment with Dr. Melida Quitter. Spoke with a Adela Lank, she stated she would call the patient to schedule appointment after speaking with the physician.

## 2017-01-23 NOTE — Progress Notes (Signed)
Oncology Nurse Navigator Documentation  Oncology Nurse Navigator Flowsheets 01/23/2017  Navigator Location [facility]  Navigator Encounter Type Other/per Dr. Julien Nordmann, he asked that I call pathology dept to get an update on DX.  I called but results are not available. I updated Dr. Julien Nordmann.   Patient Visit Type MedOnc  Treatment Phase Abnormal Scans  Barriers/Navigation Needs Coordination of Care  Interventions Coordination of Care  Coordination of Care Other  Acuity Level 2  Time Spent with Patient 15

## 2017-01-23 NOTE — Telephone Encounter (Signed)
Faxed records to [facility] [phone]

## 2017-01-23 NOTE — Progress Notes (Signed)
[facility] [phone]   Fax:(336) [phone]  OFFICE PROGRESS NOTE  Eloise Levels, NP [phone] [place] [address]  DIAGNOSIS:  1) highly suspicious for stage IV (T3, N3, M1 a) lung cancer presented with large left upper lobe lung mass in addition to mediastinal lymphadenopathy and right upper lobe nodule, pending tissue diagnosis. 2) questionable right tonsillar neoplasm.  PRIOR THERAPY: None  CURRENT THERAPY: None  INTERVAL HISTORY: Lawrence Weaver 66 y.o. male returns to the clinic today for follow-up visit accompanied by his wife and 2 sisters. The patient is feeling fine today with no specific complaints except for mild fatigue and cough in addition to intermittent left sided chest pain. He also has shortness of breath with exertion. He underwent several studies for evaluation of his disease including a PET scan, CT scan of the head. He also underwent bronchoscopy for tissue diagnosis yesterday but the final pathology is still pending. He denied having any weight loss or night sweats. He has no nausea, vomiting, diarrhea or constipation. He denied having any fever or chills. He is here today for evaluation and discussion of his scan results and further recommendation regarding his condition.  MEDICAL HISTORY: Past Medical History:  Diagnosis Date  . Cellulitis    arms - history of was hospitalized  . Dyspnea    with activity  . Mass of lung     ALLERGIES:  is allergic to no known allergies.  MEDICATIONS:  Current Outpatient Prescriptions  Medication Sig Dispense Refill  . aspirin EC 81 MG tablet Take 81 mg by mouth daily.    . diphenhydrAMINE (BENADRYL) 25 MG tablet Take 25-50 mg by mouth at bedtime as needed (1 - 2 tab).     No current facility-administered medications for this visit.     SURGICAL HISTORY:  Past Surgical History:  Procedure Laterality Date  . ANKLE SURGERY Left    fracture repair  . COLON RESECTION  1970''s    . VIDEO BRONCHOSCOPY N/A 01/22/2017   Procedure: VIDEO BRONCHOSCOPY WITH FLUORO;  Surgeon: [name], MD;  Location: [facility];  Service: Thoracic;  Laterality: N/A;    REVIEW OF SYSTEMS:  Constitutional: positive for fatigue Eyes: negative Ears, nose, mouth, throat, and face: negative Respiratory: positive for cough, dyspnea on exertion and pleurisy/chest pain Cardiovascular: negative Gastrointestinal: negative Genitourinary:negative Integument/breast: negative Hematologic/lymphatic: negative Musculoskeletal:negative Neurological: negative Behavioral/Psych: negative Endocrine: negative Allergic/Immunologic: negative   PHYSICAL EXAMINATION: General appearance: alert, cooperative, fatigued and no distress Head: Normocephalic, without obvious abnormality, atraumatic Neck: no adenopathy, no JVD, supple, symmetrical, trachea midline and thyroid not enlarged, symmetric, no tenderness/mass/nodules Lymph nodes: Cervical, supraclavicular, and axillary nodes normal. Resp: clear to auscultation bilaterally Back: symmetric, no curvature. ROM normal. No CVA tenderness. Cardio: regular rate and rhythm, S1, S2 normal, no murmur, click, rub or gallop GI: soft, non-tender; bowel sounds normal; no masses,  no organomegaly Extremities: extremities normal, atraumatic, no cyanosis or edema Neurologic: Alert and oriented X 3, normal strength and tone. Normal symmetric reflexes. Normal coordination and gait  ECOG PERFORMANCE STATUS: 1 - Symptomatic but completely ambulatory  Blood pressure 131/62, pulse (!) 103, temperature 97.7 F (36.5 C), temperature source Oral, resp. rate 18, height 6' (1.829 m), weight 184 lb 14.4 oz (83.9 kg), SpO2 97 %.  LABORATORY DATA: Lab Results  Component Value Date   WBC 19.1 (H) 01/23/2017   HGB 11.5 (L) 01/23/2017   HCT 35.1 (L) 01/23/2017   MCV 85.8 01/23/2017  PLT 439 (H) 01/23/2017      Chemistry      Component Value Date/Time   NA 135 01/22/2017  0615   NA 137 01/14/2017 1431   K 4.0 01/22/2017 0615   K 4.5 01/14/2017 1431   CL 99 (L) 01/22/2017 0615   CO2 25 01/22/2017 0615   CO2 25 01/14/2017 1431   BUN 13 01/22/2017 0615   BUN 16.9 01/14/2017 1431   CREATININE 0.97 01/22/2017 0615   CREATININE 1.1 01/14/2017 1431      Component Value Date/Time   CALCIUM 9.3 01/22/2017 0615   CALCIUM 9.6 01/14/2017 1431   ALKPHOS 109 01/22/2017 0615   ALKPHOS 98 01/14/2017 1431   AST 25 01/22/2017 0615   AST 19 01/14/2017 1431   ALT 34 01/22/2017 0615   ALT 21 01/14/2017 1431   BILITOT 0.5 01/22/2017 0615   BILITOT 0.32 01/14/2017 1431       RADIOGRAPHIC STUDIES: Dg Chest 2 View  Result Date: 01/22/2017 CLINICAL DATA:  Left lung mass EXAM: CHEST  2 VIEW COMPARISON:  PET-CT 01/21/2017 FINDINGS: Left upper lobe mass and consolidation appear unchanged. Spiculated right upper lobe mass is also visible superimposed on the first and second anterior rib ends. Lung bases are clear. No pleural effusions. IMPRESSION: Bilateral upper lobe lung masses without significant change from the recent cross-sectional imaging studies. Electronically Signed   By: Andreas Newport M.D.   On: 01/22/2017 06:13   Ct Head W Wo Contrast  Result Date: 01/21/2017 CLINICAL DATA:  New diagnosis lung cancer.  Staging. EXAM: CT HEAD WITHOUT AND WITH CONTRAST TECHNIQUE: Contiguous axial images were obtained from the base of the skull through the vertex without and with intravenous contrast CONTRAST:  64m ISOVUE-300 IOPAMIDOL (ISOVUE-300) INJECTION 61% COMPARISON:  None. FINDINGS: Brain: No evidence of malformation, atrophy, old or acute small or large vessel infarction, mass lesion, hemorrhage, hydrocephalus or extra-axial collection. No evidence of pituitary lesion. Vascular: There is atherosclerotic calcification of the major vessels at the base of the brain. Skull: Normal.  No fracture or focal bone lesion. Sinuses/Orbits: Mucosal thickening affects the sphenoid sinus.  Other visualized sinuses are clear. No fluid in the middle ears or mastoids. Visualized orbits are normal. Other: None significant IMPRESSION: Negative. No evidence of metastatic disease or other intracranial lesion. Electronically Signed   By: MNelson ChimesM.D.   On: 01/21/2017 15:39   Nm Pet Image Initial (pi) Skull Base To Thigh  Result Date: 01/21/2017 CLINICAL DATA:  Initial treatment strategy for left upper lobe lung mass and right upper lobe pulmonary nodule. EXAM: NUCLEAR MEDICINE PET SKULL BASE TO THIGH TECHNIQUE: 9.7 mCi F-18 FDG was injected intravenously. Full-ring PET imaging was performed from the skull base to thigh after the radiotracer. CT data was obtained and used for attenuation correction and anatomic localization. FASTING BLOOD GLUCOSE:  Value: 102 mg/dl COMPARISON:  Chest CT from [facility] dated 01/06/2017 FINDINGS: NECK Asymmetric soft tissue density is seen in the right piriform sinus and parapharyngeal space measuring 2.4 cm on image 26/4. This is hypermetabolic, with SUV max of 16.0. Right level 2 jugular lymph node is seen measuring 1.8 cm, with SUV max of 10.8. No other hypermetabolic cervical lymph nodes identified. CHEST A large ill-defined mass in the central left upper lobe is seen measuring approximately 6 x 8 cm, which has SUV max of 31.6. This is contiguous with the mediastinum and left hilum. There is postobstructive pneumonitis in the left upper lobe and superior left lower  lobe. A 6 mm pulmonary nodule is seen in the anterior left lower lobe on image 40/8 which shows FDG uptake with SUV max of 2.0. Tiny left pleural effusion also seen without associated hypermetabolic activity. A 2.0 cm spiculated nodule in the anterior right upper lobe is hypermetabolic, with SUV max of 11.8. A 9 mm irregular nodular density in the posterior right upper lobe on image 27/8 also shows FDG uptake with SUV max of 2.1. Small sub-cm lymph nodes with mild FDG uptake are seen within both hilar  regions into the mediastinum in the subcarinal and right paratracheal regions, none of which are pathologically enlarged. A sub-cm lymph right subpectoral lymph node is also seen on image 57/4, which shows mild FDG uptake. ABDOMEN/PELVIS No abnormal hypermetabolic activity within the liver, pancreas, adrenal glands, or spleen. No hypermetabolic lymph nodes in the abdomen or pelvis. Aortic atherosclerosis. SKELETON No focal hypermetabolic activity to suggest skeletal metastasis. Diffuse marrow hypermetabolic activity is noted, consistent with marrow stimulation. IMPRESSION: 8 cm hypermetabolic mass in central left upper lobe, most consistent with primary bronchogenic carcinoma. There is associated postobstructive pneumonitis and tiny left pleural effusion. 2 cm hypermetabolic spiculated nodule in anterior right upper lobe, suspicious for synchronous primary bronchogenic carcinoma. Two sub-cm hypermetabolic nodules are also seen within the posterior right upper lobe and anterior left lower lobe, which may be neoplastic or inflammatory in etiology. Sub-cm hypermetabolic bilateral hilar, mediastinal, and right subpectoral lymph nodes. Lymph node metastases cannot be excluded. 2.4 cm hypermetabolic right parapharyngeal/ piriform sinus soft tissue density, suspicious for primary tonsillar carcinoma. 1.8 cm hypermetabolic right level 2 jugular lymph node, highly suspicious for metastatic disease. Consider ENT consultation for direct visualization. No evidence of metastatic disease within the abdomen or pelvis. Electronically Signed   By: Earle Gell M.D.   On: 01/21/2017 15:06   Dg C-arm Bronchoscopy  Result Date: 01/22/2017 C-ARM BRONCHOSCOPY: Fluoroscopy was utilized by the requesting physician.  No radiographic interpretation.    ASSESSMENT AND PLAN: This is a very pleasant 66 years old white male with highly suspicious stage IV lung cancer presented with large left upper lobe lung mass in addition to mediastinal  lymphadenopathy and right upper lobe lung nodule in addition to suspicious right tonsillar neoplasm with right cervical lymphadenopathy. These findings on the imaging studies are highly suspicious for squamous cell carcinoma but the final pathology is still pending. The patient had a PET scan as well as CT scan of the head performed recently. I personally and independently reviewed the scan images and discuss the results and showed the images to the patient and his family. We will have to wait for the final pathology report from the bronchoscopy performed yesterday for the tissue diagnosis. I may consider the tissue biopsy for PDL 1 expression plus/minus molecular studies depending on the final pathology. I also recommended for the patient to see ENT for evaluation of the right tonsillar lesion and to rule out the presence of second neoplasm. I will see him back for follow-up visit in around 10 days for reevaluation and discussion of his treatment options based on the final pathology and ENT evaluation. He was advised to call immediately if he has any concerning symptoms in the interval. The patient voices understanding of current disease status and treatment options and is in agreement with the current care plan.  All questions were answered. The patient knows to call the clinic with any problems, questions or concerns. We can certainly see the patient much sooner if necessary.  I spent 15 minutes counseling the patient face to face. The total time spent in the appointment was 25 minutes.  Disclaimer: This note was dictated with voice recognition software. Similar sounding words can inadvertently be transcribed and may not be corrected upon review.

## 2017-01-26 ENCOUNTER — Other Ambulatory Visit: Payer: Self-pay | Admitting: Otolaryngology

## 2017-01-26 DIAGNOSIS — C090 Malignant neoplasm of tonsillar fossa: Secondary | ICD-10-CM | POA: Diagnosis not present

## 2017-01-26 DIAGNOSIS — C099 Malignant neoplasm of tonsil, unspecified: Secondary | ICD-10-CM | POA: Diagnosis not present

## 2017-01-30 ENCOUNTER — Encounter (HOSPITAL_COMMUNITY): Payer: Self-pay

## 2017-02-04 ENCOUNTER — Ambulatory Visit (HOSPITAL_BASED_OUTPATIENT_CLINIC_OR_DEPARTMENT_OTHER): Payer: Medicare HMO | Admitting: Internal Medicine

## 2017-02-04 ENCOUNTER — Other Ambulatory Visit: Payer: Self-pay | Admitting: Internal Medicine

## 2017-02-04 ENCOUNTER — Other Ambulatory Visit (HOSPITAL_BASED_OUTPATIENT_CLINIC_OR_DEPARTMENT_OTHER): Payer: Medicare HMO

## 2017-02-04 ENCOUNTER — Encounter: Payer: Self-pay | Admitting: Internal Medicine

## 2017-02-04 DIAGNOSIS — J358 Other chronic diseases of tonsils and adenoids: Secondary | ICD-10-CM

## 2017-02-04 DIAGNOSIS — C099 Malignant neoplasm of tonsil, unspecified: Secondary | ICD-10-CM

## 2017-02-04 DIAGNOSIS — C3412 Malignant neoplasm of upper lobe, left bronchus or lung: Secondary | ICD-10-CM | POA: Diagnosis not present

## 2017-02-04 DIAGNOSIS — R911 Solitary pulmonary nodule: Secondary | ICD-10-CM

## 2017-02-04 DIAGNOSIS — Z5112 Encounter for antineoplastic immunotherapy: Secondary | ICD-10-CM

## 2017-02-04 DIAGNOSIS — C771 Secondary and unspecified malignant neoplasm of intrathoracic lymph nodes: Secondary | ICD-10-CM | POA: Diagnosis not present

## 2017-02-04 DIAGNOSIS — R918 Other nonspecific abnormal finding of lung field: Secondary | ICD-10-CM

## 2017-02-04 DIAGNOSIS — C3492 Malignant neoplasm of unspecified part of left bronchus or lung: Secondary | ICD-10-CM

## 2017-02-04 DIAGNOSIS — Z7189 Other specified counseling: Secondary | ICD-10-CM

## 2017-02-04 DIAGNOSIS — Z720 Tobacco use: Secondary | ICD-10-CM

## 2017-02-04 DIAGNOSIS — R220 Localized swelling, mass and lump, head: Principal | ICD-10-CM

## 2017-02-04 HISTORY — DX: Encounter for antineoplastic immunotherapy: Z51.12

## 2017-02-04 HISTORY — DX: Other specified counseling: Z71.89

## 2017-02-04 HISTORY — DX: Malignant neoplasm of tonsil, unspecified: C09.9

## 2017-02-04 LAB — CBC WITH DIFFERENTIAL/PLATELET
BASO%: 0.2 % (ref 0.0–2.0)
BASOS ABS: 0 10*3/uL (ref 0.0–0.1)
EOS ABS: 0.3 10*3/uL (ref 0.0–0.5)
EOS%: 1.4 % (ref 0.0–7.0)
HCT: 36.1 % — ABNORMAL LOW (ref 38.4–49.9)
HEMOGLOBIN: 11.2 g/dL — AB (ref 13.0–17.1)
LYMPH%: 13 % — ABNORMAL LOW (ref 14.0–49.0)
MCH: 26.7 pg — AB (ref 27.2–33.4)
MCHC: 31 g/dL — ABNORMAL LOW (ref 32.0–36.0)
MCV: 86 fL (ref 79.3–98.0)
MONO#: 1.2 10*3/uL — ABNORMAL HIGH (ref 0.1–0.9)
MONO%: 6.3 % (ref 0.0–14.0)
NEUT#: 14.6 10*3/uL — ABNORMAL HIGH (ref 1.5–6.5)
NEUT%: 79.1 % — ABNORMAL HIGH (ref 39.0–75.0)
PLATELETS: 488 10*3/uL — AB (ref 140–400)
RBC: 4.2 10*6/uL (ref 4.20–5.82)
RDW: 14.2 % (ref 11.0–14.6)
WBC: 18.5 10*3/uL — ABNORMAL HIGH (ref 4.0–10.3)
lymph#: 2.4 10*3/uL (ref 0.9–3.3)

## 2017-02-04 LAB — COMPREHENSIVE METABOLIC PANEL
ALBUMIN: 2.6 g/dL — AB (ref 3.5–5.0)
ALT: 20 U/L (ref 0–55)
ANION GAP: 10 meq/L (ref 3–11)
AST: 14 U/L (ref 5–34)
Alkaline Phosphatase: 101 U/L (ref 40–150)
BILIRUBIN TOTAL: 0.39 mg/dL (ref 0.20–1.20)
BUN: 12 mg/dL (ref 7.0–26.0)
CO2: 25 mEq/L (ref 22–29)
CREATININE: 0.9 mg/dL (ref 0.7–1.3)
Calcium: 9.6 mg/dL (ref 8.4–10.4)
Chloride: 103 mEq/L (ref 98–109)
EGFR: 90 mL/min/{1.73_m2} — AB (ref >90)
GLUCOSE: 96 mg/dL (ref 70–140)
POTASSIUM: 4.9 meq/L (ref 3.5–5.1)
SODIUM: 137 meq/L (ref 136–145)
Total Protein: 7.6 g/dL (ref 6.4–8.3)

## 2017-02-04 NOTE — Progress Notes (Signed)
[facility] Telephone:([phone]   Fax:([phone]  OFFICE PROGRESS NOTE  Eloise Levels, NP 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  PRIOR THERAPY: None  CURRENT THERAPY: Immunotherapy with Ketruda (pembrolizumab) 200 MG IV every 3 weeks. First dose 02/11/2017.  INTERVAL HISTORY: Lawrence Weaver 66 y.o. male returns to the clinic today for follow-up visit accompanied by his wife. The patient is feeling fine today with no specific complaints except for shortness breath and cough. He denied having any current chest pain or hemoptysis. He denied having any recent weight loss or night sweats. He has no nausea, vomiting, diarrhea or constipation. He was referred recently to Dr. Lucia Gaskins and he underwent biopsy of the right tonsil that was consistent with squamous cell carcinoma of the tonsil, P 16 was positive which is different from his lung cancer squamous cell carcinoma which is negative for P 16. Motor study for the lung biopsy showed PDL 1 expression of 95%. The patient is here today for evaluation and discussion of his treatment options.  MEDICAL HISTORY: Past Medical History:  Diagnosis Date  . Cellulitis    arms - history of was hospitalized  . Dyspnea    with activity  . Mass of lung   . Tonsillar mass 01/23/2017    ALLERGIES:  is allergic to no known allergies.  MEDICATIONS:  Current Outpatient Prescriptions  Medication Sig Dispense Refill  . aspirin EC 81 MG tablet Take 81 mg by mouth daily.    . diphenhydrAMINE (BENADRYL) 25 MG tablet Take 25-50 mg by mouth at bedtime as needed (1 -  2 tab).     No current facility-administered medications for this visit.     SURGICAL HISTORY:  Past Surgical History:  Procedure Laterality Date  . ANKLE SURGERY Left    fracture repair  . COLON RESECTION  1970''s  . VIDEO BRONCHOSCOPY N/A 01/22/2017   Procedure: VIDEO BRONCHOSCOPY WITH FLUORO;  Surgeon: [name], MD;  Location: [facility];  Service: Thoracic;  Laterality: N/A;    REVIEW OF SYSTEMS:  Constitutional: positive for fatigue Eyes: negative Ears, nose, mouth, throat, and face: negative Respiratory: positive for cough and dyspnea on exertion Cardiovascular: negative Gastrointestinal: negative Genitourinary:negative Integument/breast: negative Hematologic/lymphatic: negative Musculoskeletal:negative Neurological: negative Behavioral/Psych: negative Endocrine: negative Allergic/Immunologic: negative   PHYSICAL EXAMINATION: General appearance: alert, cooperative, fatigued and no distress Head: Normocephalic, without obvious abnormality, atraumatic Neck: no adenopathy, no JVD, supple, symmetrical, trachea midline and thyroid not enlarged, symmetric, no tenderness/mass/nodules Lymph nodes: Cervical, supraclavicular, and axillary nodes normal. Resp: clear to auscultation bilaterally Back: symmetric, no curvature. ROM normal. No CVA tenderness. Cardio: regular rate and rhythm, S1, S2 normal, no murmur, click, rub or gallop GI: soft, non-tender; bowel sounds normal; no masses,  no organomegaly Extremities: extremities normal, atraumatic, no cyanosis or edema Neurologic: Alert and oriented X 3, normal strength and tone. Normal symmetric reflexes. Normal coordination and gait  ECOG PERFORMANCE STATUS: 1 - Symptomatic but completely ambulatory  Blood pressure 108/60, pulse 82, temperature 98.2 F (36.8 C), resp. rate 18, height 6' (1.829 m), weight 178 lb 8 oz (81 kg), SpO2  98 %.  LABORATORY DATA: Lab Results  Component Value Date   WBC 18.5 (H) 02/04/2017   HGB  11.2 (L) 02/04/2017   HCT 36.1 (L) 02/04/2017   MCV 86.0 02/04/2017   PLT 488 (H) 02/04/2017      Chemistry      Component Value Date/Time   NA 137 02/04/2017 1200   K 4.9 02/04/2017 1200   CL 99 (L) 01/22/2017 0615   CO2 25 02/04/2017 1200   BUN 12.0 02/04/2017 1200   CREATININE 0.9 02/04/2017 1200      Component Value Date/Time   CALCIUM 9.6 02/04/2017 1200   ALKPHOS 101 02/04/2017 1200   AST 14 02/04/2017 1200   ALT 20 02/04/2017 1200   BILITOT 0.39 02/04/2017 1200       RADIOGRAPHIC STUDIES: Dg Chest 2 View  Result Date: 01/22/2017 CLINICAL DATA:  Left lung mass EXAM: CHEST  2 VIEW COMPARISON:  PET-CT 01/21/2017 FINDINGS: Left upper lobe mass and consolidation appear unchanged. Spiculated right upper lobe mass is also visible superimposed on the first and second anterior rib ends. Lung bases are clear. No pleural effusions. IMPRESSION: Bilateral upper lobe lung masses without significant change from the recent cross-sectional imaging studies. Electronically Signed   By: Andreas Newport M.D.   On: 01/22/2017 06:13   Ct Head W Wo Contrast  Result Date: 01/21/2017 CLINICAL DATA:  New diagnosis lung cancer.  Staging. EXAM: CT HEAD WITHOUT AND WITH CONTRAST TECHNIQUE: Contiguous axial images were obtained from the base of the skull through the vertex without and with intravenous contrast CONTRAST:  40m ISOVUE-300 IOPAMIDOL (ISOVUE-300) INJECTION 61% COMPARISON:  None. FINDINGS: Brain: No evidence of malformation, atrophy, old or acute small or large vessel infarction, mass lesion, hemorrhage, hydrocephalus or extra-axial collection. No evidence of pituitary lesion. Vascular: There is atherosclerotic calcification of the major vessels at the base of the brain. Skull: Normal.  No fracture or focal bone lesion. Sinuses/Orbits: Mucosal thickening affects the sphenoid sinus. Other visualized sinuses are clear. No fluid in the middle ears or mastoids. Visualized orbits are normal. Other:  None significant IMPRESSION: Negative. No evidence of metastatic disease or other intracranial lesion. Electronically Signed   By: MNelson ChimesM.D.   On: 01/21/2017 15:39   Nm Pet Image Initial (pi) Skull Base To Thigh  Result Date: 01/21/2017 CLINICAL DATA:  Initial treatment strategy for left upper lobe lung mass and right upper lobe pulmonary nodule. EXAM: NUCLEAR MEDICINE PET SKULL BASE TO THIGH TECHNIQUE: 9.7 mCi F-18 FDG was injected intravenously. Full-ring PET imaging was performed from the skull base to thigh after the radiotracer. CT data was obtained and used for attenuation correction and anatomic localization. FASTING BLOOD GLUCOSE:  Value: 102 mg/dl COMPARISON:  Chest CT from [facility] dated 01/06/2017 FINDINGS: NECK Asymmetric soft tissue density is seen in the right piriform sinus and parapharyngeal space measuring 2.4 cm on image 26/4. This is hypermetabolic, with SUV max of 16.0. Right level 2 jugular lymph node is seen measuring 1.8 cm, with SUV max of 10.8. No other hypermetabolic cervical lymph nodes identified. CHEST A large ill-defined mass in the central left upper lobe is seen measuring approximately 6 x 8 cm, which has SUV max of 31.6. This is contiguous with the mediastinum and left hilum. There is postobstructive pneumonitis in the left upper lobe and superior left lower lobe. A 6 mm pulmonary nodule is seen in the anterior left lower lobe on image 40/8 which shows FDG uptake with SUV max  of 2.0. Tiny left pleural effusion also seen without associated hypermetabolic activity. A 2.0 cm spiculated nodule in the anterior right upper lobe is hypermetabolic, with SUV max of 11.8. A 9 mm irregular nodular density in the posterior right upper lobe on image 27/8 also shows FDG uptake with SUV max of 2.1. Small sub-cm lymph nodes with mild FDG uptake are seen within both hilar regions into the mediastinum in the subcarinal and right paratracheal regions, none of which are pathologically  enlarged. A sub-cm lymph right subpectoral lymph node is also seen on image 57/4, which shows mild FDG uptake. ABDOMEN/PELVIS No abnormal hypermetabolic activity within the liver, pancreas, adrenal glands, or spleen. No hypermetabolic lymph nodes in the abdomen or pelvis. Aortic atherosclerosis. SKELETON No focal hypermetabolic activity to suggest skeletal metastasis. Diffuse marrow hypermetabolic activity is noted, consistent with marrow stimulation. IMPRESSION: 8 cm hypermetabolic mass in central left upper lobe, most consistent with primary bronchogenic carcinoma. There is associated postobstructive pneumonitis and tiny left pleural effusion. 2 cm hypermetabolic spiculated nodule in anterior right upper lobe, suspicious for synchronous primary bronchogenic carcinoma. Two sub-cm hypermetabolic nodules are also seen within the posterior right upper lobe and anterior left lower lobe, which may be neoplastic or inflammatory in etiology. Sub-cm hypermetabolic bilateral hilar, mediastinal, and right subpectoral lymph nodes. Lymph node metastases cannot be excluded. 2.4 cm hypermetabolic right parapharyngeal/ piriform sinus soft tissue density, suspicious for primary tonsillar carcinoma. 1.8 cm hypermetabolic right level 2 jugular lymph node, highly suspicious for metastatic disease. Consider ENT consultation for direct visualization. No evidence of metastatic disease within the abdomen or pelvis. Electronically Signed   By: Earle Gell M.D.   On: 01/21/2017 15:06   Dg C-arm Bronchoscopy  Result Date: 01/22/2017 C-ARM BRONCHOSCOPY: Fluoroscopy was utilized by the requesting physician.  No radiographic interpretation.    ASSESSMENT AND PLAN:  This is a very pleasant 66 years old white male with a stage IV non-small cell lung cancer, squamous cell carcinoma presented with large left upper lobe lung mass in addition to mediastinal lymphadenopathy and right upper lobe nodule in addition to a second malignancy with  squamous cell carcinoma of the right tonsil with right cervical lymphadenopathy. PDL 1 expression of the lung biopsy showed 95% TPS. I had a lengthy discussion with the patient and his wife today about his current disease stage, prognosis and treatment options. 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 I discussed with the patient his treatment option and goals of care. He understands that the treatment of palliative nature and he has incurable condition. I gave the patient the option of palliative care versus treatment with immunotherapy with Ketruda (pembrolizumab) 200 MG IV every 3 weeks versus treatment with systemic chemotherapy with carboplatin and paclitaxel. The patient is interested in the treatment with the immunotherapy. I discussed with the patient adverse effect of this treatment including but not limited to immune mediated skin rash, diarrhea, pneumonitis, liver, renal, thyroid or other endocrine dysfunction. He is expected to start the first cycle of this treatment on 02/11/2017. I will arrange for the patient to have a chemotherapy education class before starting the first dose of his treatment. I was him back for follow-up visit in 4 weeks for evaluation and management of any adverse effect of his treatment before starting cycle #2. He was advised to call  immediately if he has any concerning symptoms in the interval. The patient voices understanding of current disease status and treatment options and is in agreement with the current care plan. All questions were answered. The patient knows to call the clinic with any problems, questions or concerns. We can certainly see the patient much sooner if necessary.  Disclaimer: This note was dictated with voice recognition software. Similar  sounding words can inadvertently be transcribed and may not be corrected upon review.

## 2017-02-04 NOTE — Progress Notes (Signed)
START ON PATHWAY REGIMEN - Non-Small Cell Lung     A cycle is 21 days:     Pembrolizumab   **Always confirm dose/schedule in your pharmacy ordering system**    Patient Characteristics: Stage IV Metastatic, Squamous, PS = 0, 1, First Line, PD-L1 Expression Positive  >= 50% (TPS) AJCC T Category: T3 Current Disease Status: Distant Metastases AJCC N Category: N3 AJCC M Category: M1c AJCC 8 Stage Grouping: IVB Histology: Squamous Cell Line of therapy: First Line PD-L1 Expression Status: PD-L1 Positive >= 50% (TPS) Performance Status: PS = 0, 1 Would you be surprised if this patient died  in the next year? I would NOT be surprised if this patient died in the next year  Intent of Therapy: Non-Curative / Palliative Intent, Discussed with Patient

## 2017-02-06 ENCOUNTER — Telehealth: Payer: Self-pay | Admitting: Internal Medicine

## 2017-02-06 NOTE — Telephone Encounter (Signed)
Scheduled appts per 4/18 los. Patient aware of appts and when treatment is to start and will pick up appt schedule when he comes in for chemo edu on 4/23

## 2017-02-09 ENCOUNTER — Other Ambulatory Visit: Payer: Medicare HMO

## 2017-02-11 ENCOUNTER — Ambulatory Visit (HOSPITAL_BASED_OUTPATIENT_CLINIC_OR_DEPARTMENT_OTHER): Payer: Medicare HMO

## 2017-02-11 ENCOUNTER — Other Ambulatory Visit: Payer: Self-pay

## 2017-02-11 VITALS — BP 95/61 | HR 86 | Temp 98.8°F | Resp 18

## 2017-02-11 DIAGNOSIS — C3492 Malignant neoplasm of unspecified part of left bronchus or lung: Secondary | ICD-10-CM

## 2017-02-11 DIAGNOSIS — C3412 Malignant neoplasm of upper lobe, left bronchus or lung: Secondary | ICD-10-CM

## 2017-02-11 DIAGNOSIS — Z5112 Encounter for antineoplastic immunotherapy: Secondary | ICD-10-CM | POA: Diagnosis not present

## 2017-02-11 MED ORDER — SODIUM CHLORIDE 0.9 % IV SOLN
Freq: Once | INTRAVENOUS | Status: AC
  Administered 2017-02-11: 08:00:00 via INTRAVENOUS

## 2017-02-11 MED ORDER — SODIUM CHLORIDE 0.9 % IV SOLN
200.0000 mg | Freq: Once | INTRAVENOUS | Status: AC
  Administered 2017-02-11: 200 mg via INTRAVENOUS
  Filled 2017-02-11: qty 8

## 2017-02-11 NOTE — Patient Instructions (Signed)
[facility] Discharge Instructions for Patients Receiving Chemotherapy  Today you received the following chemotherapy agents Keytruda    If you develop nausea and vomiting that is not controlled by your nausea medication, call the clinic.   BELOW ARE SYMPTOMS THAT SHOULD BE REPORTED IMMEDIATELY:  *FEVER GREATER THAN 100.5 F  *CHILLS WITH OR WITHOUT FEVER  NAUSEA AND VOMITING THAT IS NOT CONTROLLED WITH YOUR NAUSEA MEDICATION  *UNUSUAL SHORTNESS OF BREATH  *UNUSUAL BRUISING OR BLEEDING  TENDERNESS IN MOUTH AND THROAT WITH OR WITHOUT PRESENCE OF ULCERS  *URINARY PROBLEMS  *BOWEL PROBLEMS  UNUSUAL RASH Items with * indicate a potential emergency and should be followed up as soon as possible.  Feel free to call the clinic you have any questions or concerns. The clinic phone number is [phone].  Please show the [facility] at check-in to the Emergency Department and triage nurse.

## 2017-02-17 ENCOUNTER — Ambulatory Visit: Payer: Medicare HMO | Admitting: Hematology and Oncology

## 2017-02-18 ENCOUNTER — Ambulatory Visit (HOSPITAL_BASED_OUTPATIENT_CLINIC_OR_DEPARTMENT_OTHER): Payer: Medicare HMO | Admitting: Internal Medicine

## 2017-02-18 ENCOUNTER — Encounter: Payer: Self-pay | Admitting: Internal Medicine

## 2017-02-18 VITALS — BP 94/77 | HR 97 | Temp 98.5°F | Ht 74.49 in | Wt 174.8 lb

## 2017-02-18 DIAGNOSIS — C771 Secondary and unspecified malignant neoplasm of intrathoracic lymph nodes: Secondary | ICD-10-CM | POA: Diagnosis not present

## 2017-02-18 DIAGNOSIS — R630 Anorexia: Secondary | ICD-10-CM

## 2017-02-18 DIAGNOSIS — C3412 Malignant neoplasm of upper lobe, left bronchus or lung: Secondary | ICD-10-CM | POA: Diagnosis not present

## 2017-02-18 DIAGNOSIS — L299 Pruritus, unspecified: Secondary | ICD-10-CM

## 2017-02-18 DIAGNOSIS — R110 Nausea: Secondary | ICD-10-CM

## 2017-02-18 DIAGNOSIS — C3492 Malignant neoplasm of unspecified part of left bronchus or lung: Secondary | ICD-10-CM

## 2017-02-18 DIAGNOSIS — C099 Malignant neoplasm of tonsil, unspecified: Secondary | ICD-10-CM | POA: Diagnosis not present

## 2017-02-18 DIAGNOSIS — R634 Abnormal weight loss: Secondary | ICD-10-CM

## 2017-02-18 DIAGNOSIS — Z5112 Encounter for antineoplastic immunotherapy: Secondary | ICD-10-CM

## 2017-02-18 HISTORY — DX: Pruritus, unspecified: L29.9

## 2017-02-18 MED ORDER — PROCHLORPERAZINE MALEATE 10 MG PO TABS: 10.0000 mg | ORAL_TABLET | Freq: Four times a day (QID) | ORAL | 0 refills | Status: AC | PRN

## 2017-02-18 MED ORDER — FAMOTIDINE 20 MG PO TABS: 20.0000 mg | ORAL_TABLET | Freq: Two times a day (BID) | ORAL | 1 refills | Status: AC

## 2017-02-18 NOTE — Progress Notes (Signed)
[facility] [phone]   [phone]  OFFICE PROGRESS NOTE  Eloise Levels, NP 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  PRIOR THERAPY: None  CURRENT THERAPY: Immunotherapy with Ketruda (pembrolizumab) 200 MG IV every 3 weeks. First dose 02/11/2017. Status post one cycle  INTERVAL HISTORY: Lawrence Weaver 66 y.o. male returns to the clinic today for follow-up visit accompanied by his wife. The patient tolerated the first cycle of his treatment with immunotherapy with Ketruda (pembrolizumab) fairly well except for itching. He tried Benadryl at nighttime as well as hydrocortisone cream with some improvement. He denied having any nausea or vomiting. He lost around 4 pounds since his last visit because of lack of appetite. He denied having any chest pain, shortness of breath, cough or hemoptysis. He has no fever or chills. He is here today for evaluation and repeat blood work.  MEDICAL HISTORY: Past Medical History:  Diagnosis Date  . Cellulitis    arms - history of was hospitalized  . Dyspnea    with activity  . Encounter for antineoplastic immunotherapy 02/04/2017  . Goals of care, counseling/discussion 02/04/2017  . Mass of lung   . Right tonsillar squamous cell carcinoma ([place]) 02/04/2017  . Tonsillar mass 01/23/2017    ALLERGIES:  is allergic to no known allergies.  MEDICATIONS:  Current Outpatient Prescriptions  Medication Sig Dispense Refill  . aspirin EC 81 MG tablet Take 81 mg by mouth daily.    . diphenhydrAMINE (BENADRYL) 25 MG tablet Take 25-50 mg by mouth at bedtime as  needed (1 - 2 tab).     No current facility-administered medications for this visit.     SURGICAL HISTORY:  Past Surgical History:  Procedure Laterality Date  . ANKLE SURGERY Left    fracture repair  . COLON RESECTION  1970''s  . VIDEO BRONCHOSCOPY N/A 01/22/2017   Procedure: VIDEO BRONCHOSCOPY WITH FLUORO;  Surgeon: [name], MD;  Location: [facility];  Service: Thoracic;  Laterality: N/A;    REVIEW OF SYSTEMS:  A comprehensive review of systems was negative except for: Integument/breast: positive for pruritus   PHYSICAL EXAMINATION: General appearance: alert, cooperative and no distress Head: Normocephalic, without obvious abnormality, atraumatic Neck: no adenopathy, no JVD, supple, symmetrical, trachea midline and thyroid not enlarged, symmetric, no tenderness/mass/nodules Lymph nodes: Cervical, supraclavicular, and axillary nodes normal. Resp: clear to auscultation bilaterally Back: symmetric, no curvature. ROM normal. No CVA tenderness. Cardio: regular rate and rhythm, S1, S2 normal, no murmur, click, rub or gallop GI: soft, non-tender; bowel sounds normal; no masses,  no organomegaly Extremities: extremities normal, atraumatic, no cyanosis or edema  ECOG PERFORMANCE STATUS: 1 - Symptomatic but completely ambulatory  Blood pressure 94/77, pulse 97, temperature 98.5 F (36.9 C), temperature source Oral, height 6' 2.49" (1.892 m), weight 174 lb 12.8 oz (79.3 kg), SpO2 99 %.  LABORATORY DATA: Lab Results  Component Value Date   WBC 18.5 (H) 02/04/2017   HGB 11.2 (L) 02/04/2017   HCT 36.1 (L) 02/04/2017   MCV 86.0 02/04/2017   PLT 488 (H) 02/04/2017      Chemistry  Component Value Date/Time   NA 137 02/04/2017 1200   K 4.9 02/04/2017 1200   CL 99 (L) 01/22/2017 0615   CO2 25 02/04/2017 1200   BUN 12.0 02/04/2017 1200   CREATININE 0.9 02/04/2017 1200      Component Value Date/Time   CALCIUM 9.6 02/04/2017 1200   ALKPHOS 101 02/04/2017 1200   AST 14  02/04/2017 1200   ALT 20 02/04/2017 1200   BILITOT 0.39 02/04/2017 1200       RADIOGRAPHIC STUDIES: Dg Chest 2 View  Result Date: 01/22/2017 CLINICAL DATA:  Left lung mass EXAM: CHEST  2 VIEW COMPARISON:  PET-CT 01/21/2017 FINDINGS: Left upper lobe mass and consolidation appear unchanged. Spiculated right upper lobe mass is also visible superimposed on the first and second anterior rib ends. Lung bases are clear. No pleural effusions. IMPRESSION: Bilateral upper lobe lung masses without significant change from the recent cross-sectional imaging studies. Electronically Signed   By: Andreas Newport M.D.   On: 01/22/2017 06:13   Ct Head W Wo Contrast  Result Date: 01/21/2017 CLINICAL DATA:  New diagnosis lung cancer.  Staging. EXAM: CT HEAD WITHOUT AND WITH CONTRAST TECHNIQUE: Contiguous axial images were obtained from the base of the skull through the vertex without and with intravenous contrast CONTRAST:  37m ISOVUE-300 IOPAMIDOL (ISOVUE-300) INJECTION 61% COMPARISON:  None. FINDINGS: Brain: No evidence of malformation, atrophy, old or acute small or large vessel infarction, mass lesion, hemorrhage, hydrocephalus or extra-axial collection. No evidence of pituitary lesion. Vascular: There is atherosclerotic calcification of the major vessels at the base of the brain. Skull: Normal.  No fracture or focal bone lesion. Sinuses/Orbits: Mucosal thickening affects the sphenoid sinus. Other visualized sinuses are clear. No fluid in the middle ears or mastoids. Visualized orbits are normal. Other: None significant IMPRESSION: Negative. No evidence of metastatic disease or other intracranial lesion. Electronically Signed   By: MNelson ChimesM.D.   On: 01/21/2017 15:39   Nm Pet Image Initial (pi) Skull Base To Thigh  Result Date: 01/21/2017 CLINICAL DATA:  Initial treatment strategy for left upper lobe lung mass and right upper lobe pulmonary nodule. EXAM: NUCLEAR MEDICINE PET SKULL BASE TO THIGH TECHNIQUE: 9.7  mCi F-18 FDG was injected intravenously. Full-ring PET imaging was performed from the skull base to thigh after the radiotracer. CT data was obtained and used for attenuation correction and anatomic localization. FASTING BLOOD GLUCOSE:  Value: 102 mg/dl COMPARISON:  Chest CT from [facility] dated 01/06/2017 FINDINGS: NECK Asymmetric soft tissue density is seen in the right piriform sinus and parapharyngeal space measuring 2.4 cm on image 26/4. This is hypermetabolic, with SUV max of 16.0. Right level 2 jugular lymph node is seen measuring 1.8 cm, with SUV max of 10.8. No other hypermetabolic cervical lymph nodes identified. CHEST A large ill-defined mass in the central left upper lobe is seen measuring approximately 6 x 8 cm, which has SUV max of 31.6. This is contiguous with the mediastinum and left hilum. There is postobstructive pneumonitis in the left upper lobe and superior left lower lobe. A 6 mm pulmonary nodule is seen in the anterior left lower lobe on image 40/8 which shows FDG uptake with SUV max of 2.0. Tiny left pleural effusion also seen without associated hypermetabolic activity. A 2.0 cm spiculated nodule in the anterior right upper lobe is hypermetabolic, with SUV max of 11.8. A 9 mm irregular nodular density in the posterior right upper lobe on image 27/8 also shows FDG uptake with SUV  max of 2.1. Small sub-cm lymph nodes with mild FDG uptake are seen within both hilar regions into the mediastinum in the subcarinal and right paratracheal regions, none of which are pathologically enlarged. A sub-cm lymph right subpectoral lymph node is also seen on image 57/4, which shows mild FDG uptake. ABDOMEN/PELVIS No abnormal hypermetabolic activity within the liver, pancreas, adrenal glands, or spleen. No hypermetabolic lymph nodes in the abdomen or pelvis. Aortic atherosclerosis. SKELETON No focal hypermetabolic activity to suggest skeletal metastasis. Diffuse marrow hypermetabolic activity is noted,  consistent with marrow stimulation. IMPRESSION: 8 cm hypermetabolic mass in central left upper lobe, most consistent with primary bronchogenic carcinoma. There is associated postobstructive pneumonitis and tiny left pleural effusion. 2 cm hypermetabolic spiculated nodule in anterior right upper lobe, suspicious for synchronous primary bronchogenic carcinoma. Two sub-cm hypermetabolic nodules are also seen within the posterior right upper lobe and anterior left lower lobe, which may be neoplastic or inflammatory in etiology. Sub-cm hypermetabolic bilateral hilar, mediastinal, and right subpectoral lymph nodes. Lymph node metastases cannot be excluded. 2.4 cm hypermetabolic right parapharyngeal/ piriform sinus soft tissue density, suspicious for primary tonsillar carcinoma. 1.8 cm hypermetabolic right level 2 jugular lymph node, highly suspicious for metastatic disease. Consider ENT consultation for direct visualization. No evidence of metastatic disease within the abdomen or pelvis. Electronically Signed   By: Earle Gell M.D.   On: 01/21/2017 15:06   Dg C-arm Bronchoscopy  Result Date: 01/22/2017 C-ARM BRONCHOSCOPY: Fluoroscopy was utilized by the requesting physician.  No radiographic interpretation.    ASSESSMENT AND PLAN:  This is a very pleasant 66 years old white male with a stage IV non-small cell lung cancer, squamous cell carcinoma presented with large left upper lobe lung mass in addition to mediastinal lymphadenopathy and right upper lobe nodule. The patient also has squamous cell carcinoma of the right tonsil with cervical lymphadenopathy. His PDL 1 expression is 95%. He is currently on treatment with Ketruda (pembrolizumab) status post 1 cycle and tolerated the first week of his treatment well except for itching. He is currently on treatment with Benadryl and hydrocortisone cream. I also recommended for the patient to add Pepcid twice daily. Needed. Consider the patient for treatment with  Atarax. For the occasional nausea, I will send prescription for Compazine 10 mg by mouth every 6 hours as needed to his pharmacy. I will send back for follow-up visit in 2 weeks for evaluation before starting cycle #2. He was advised to call immediately if he has any concerning symptoms in the interval. The patient voices understanding of current disease status and treatment options and is in agreement with the current care plan. All questions were answered. The patient knows to call the clinic with any problems, questions or concerns. We can certainly see the patient much sooner if necessary. I spent 10 minutes counseling the patient face to face. The total time spent in the appointment was 15 minutes.  Disclaimer: This note was dictated with voice recognition software. Similar sounding words can inadvertently be transcribed and may not be corrected upon review.

## 2017-02-19 ENCOUNTER — Encounter: Payer: Self-pay | Admitting: Internal Medicine

## 2017-02-23 ENCOUNTER — Telehealth: Payer: Self-pay | Admitting: Internal Medicine

## 2017-02-23 NOTE — Telephone Encounter (Signed)
Scheduled appt per 5/2 los. - patient to pick up new schedule next visit

## 2017-03-04 ENCOUNTER — Encounter: Payer: Self-pay | Admitting: Nurse Practitioner

## 2017-03-04 ENCOUNTER — Ambulatory Visit (HOSPITAL_COMMUNITY)
Admission: RE | Admit: 2017-03-04 | Discharge: 2017-03-04 | Disposition: A | Discharge status: Home / Self Care | Payer: Medicare HMO | Source: Ambulatory Visit | Attending: Nurse Practitioner | Admitting: Nurse Practitioner

## 2017-03-04 ENCOUNTER — Ambulatory Visit (HOSPITAL_BASED_OUTPATIENT_CLINIC_OR_DEPARTMENT_OTHER): Payer: Medicare HMO | Admitting: Nurse Practitioner

## 2017-03-04 ENCOUNTER — Encounter: Payer: Self-pay | Admitting: Radiation Oncology

## 2017-03-04 ENCOUNTER — Other Ambulatory Visit (HOSPITAL_BASED_OUTPATIENT_CLINIC_OR_DEPARTMENT_OTHER): Payer: Medicare HMO

## 2017-03-04 ENCOUNTER — Ambulatory Visit: Payer: Medicare HMO

## 2017-03-04 ENCOUNTER — Ambulatory Visit (HOSPITAL_BASED_OUTPATIENT_CLINIC_OR_DEPARTMENT_OTHER)
Admission: RE | Admit: 2017-03-04 | Discharge: 2017-03-04 | Disposition: A | Discharge status: Home / Self Care | Payer: Medicare HMO | Source: Ambulatory Visit | Attending: Nurse Practitioner | Admitting: Nurse Practitioner

## 2017-03-04 ENCOUNTER — Encounter (HOSPITAL_COMMUNITY): Payer: Self-pay

## 2017-03-04 VITALS — BP 110/68 | HR 92 | Temp 97.8°F | Resp 18 | Ht 74.49 in | Wt 172.1 lb

## 2017-03-04 DIAGNOSIS — C3492 Malignant neoplasm of unspecified part of left bronchus or lung: Secondary | ICD-10-CM | POA: Diagnosis not present

## 2017-03-04 DIAGNOSIS — C3412 Malignant neoplasm of upper lobe, left bronchus or lung: Secondary | ICD-10-CM

## 2017-03-04 DIAGNOSIS — L299 Pruritus, unspecified: Secondary | ICD-10-CM | POA: Diagnosis not present

## 2017-03-04 DIAGNOSIS — R911 Solitary pulmonary nodule: Secondary | ICD-10-CM

## 2017-03-04 DIAGNOSIS — Z79899 Other long term (current) drug therapy: Secondary | ICD-10-CM

## 2017-03-04 DIAGNOSIS — J90 Pleural effusion, not elsewhere classified: Secondary | ICD-10-CM | POA: Insufficient documentation

## 2017-03-04 DIAGNOSIS — Z5112 Encounter for antineoplastic immunotherapy: Secondary | ICD-10-CM

## 2017-03-04 DIAGNOSIS — C771 Secondary and unspecified malignant neoplasm of intrathoracic lymph nodes: Secondary | ICD-10-CM

## 2017-03-04 DIAGNOSIS — M79661 Pain in right lower leg: Secondary | ICD-10-CM | POA: Diagnosis not present

## 2017-03-04 DIAGNOSIS — C099 Malignant neoplasm of tonsil, unspecified: Secondary | ICD-10-CM | POA: Diagnosis not present

## 2017-03-04 DIAGNOSIS — R609 Edema, unspecified: Secondary | ICD-10-CM | POA: Diagnosis not present

## 2017-03-04 DIAGNOSIS — C3411 Malignant neoplasm of upper lobe, right bronchus or lung: Secondary | ICD-10-CM | POA: Diagnosis not present

## 2017-03-04 DIAGNOSIS — R05 Cough: Secondary | ICD-10-CM

## 2017-03-04 DIAGNOSIS — R599 Enlarged lymph nodes, unspecified: Secondary | ICD-10-CM | POA: Diagnosis not present

## 2017-03-04 DIAGNOSIS — R042 Hemoptysis: Secondary | ICD-10-CM

## 2017-03-04 HISTORY — DX: Hemoptysis: R04.2

## 2017-03-04 LAB — CBC WITH DIFFERENTIAL/PLATELET
BASO%: 0.5 % (ref 0.0–2.0)
BASOS ABS: 0.1 10*3/uL (ref 0.0–0.1)
EOS ABS: 0.5 10*3/uL (ref 0.0–0.5)
EOS%: 2.4 % (ref 0.0–7.0)
HCT: 33.5 % — ABNORMAL LOW (ref 38.4–49.9)
HGB: 10.7 g/dL — ABNORMAL LOW (ref 13.0–17.1)
LYMPH%: 8.9 % — AB (ref 14.0–49.0)
MCH: 26.2 pg — ABNORMAL LOW (ref 27.2–33.4)
MCHC: 31.8 g/dL — AB (ref 32.0–36.0)
MCV: 82.4 fL (ref 79.3–98.0)
MONO#: 1.2 10*3/uL — ABNORMAL HIGH (ref 0.1–0.9)
MONO%: 5.8 % (ref 0.0–14.0)
NEUT#: 17 10*3/uL — ABNORMAL HIGH (ref 1.5–6.5)
NEUT%: 82.4 % — AB (ref 39.0–75.0)
PLATELETS: 557 10*3/uL — AB (ref 140–400)
RBC: 4.07 10*6/uL — AB (ref 4.20–5.82)
RDW: 15.2 % — ABNORMAL HIGH (ref 11.0–14.6)
WBC: 20.7 10*3/uL — ABNORMAL HIGH (ref 4.0–10.3)
lymph#: 1.8 10*3/uL (ref 0.9–3.3)

## 2017-03-04 LAB — COMPREHENSIVE METABOLIC PANEL
ALT: 24 U/L (ref 0–55)
ANION GAP: 9 meq/L (ref 3–11)
AST: 19 U/L (ref 5–34)
Albumin: 2.2 g/dL — ABNORMAL LOW (ref 3.5–5.0)
Alkaline Phosphatase: 113 U/L (ref 40–150)
BILIRUBIN TOTAL: 0.31 mg/dL (ref 0.20–1.20)
BUN: 13.8 mg/dL (ref 7.0–26.0)
CALCIUM: 9.1 mg/dL (ref 8.4–10.4)
CO2: 25 meq/L (ref 22–29)
Chloride: 102 mEq/L (ref 98–109)
Creatinine: 0.8 mg/dL (ref 0.7–1.3)
GLUCOSE: 118 mg/dL (ref 70–140)
POTASSIUM: 3.9 meq/L (ref 3.5–5.1)
Sodium: 136 mEq/L (ref 136–145)
Total Protein: 6.9 g/dL (ref 6.4–8.3)

## 2017-03-04 LAB — TSH: TSH: 1.118 m(IU)/L (ref 0.320–4.118)

## 2017-03-04 MED ORDER — METHYLPREDNISOLONE 4 MG PO TBPK: ORAL_TABLET | ORAL | 0 refills | Status: AC

## 2017-03-04 MED ORDER — IOPAMIDOL (ISOVUE-370) INJECTION 76%
INTRAVENOUS | Status: AC
  Filled 2017-03-04: qty 100

## 2017-03-04 MED ORDER — IOPAMIDOL (ISOVUE-370) INJECTION 76%
100.0000 mL | Freq: Once | INTRAVENOUS | Status: AC | PRN
  Administered 2017-03-04: 80 mL via INTRAVENOUS

## 2017-03-04 NOTE — Progress Notes (Signed)
**  Preliminary report by tech**  Right lower extremity venous duplex complete. There is no evidence of deep or superficial vein thrombosis involving the right lower extremity. All visualized vessels appear patent and compressible. There is no evidence of a Baker's cyst on the right. Incidental findings are consistent with: enlarged lymph node measuring 0.8 cm high by 2.7 cm wide by 3.7 cm long on the right. Additional incidental findings are consistent with an anechoic structure with low level echoes is present extending from the proximal to the distal calf. Results were given to Clarise Cruz at [employer]' office.  03/04/17 12:12 PM Lawrence Weaver RVT

## 2017-03-04 NOTE — Progress Notes (Addendum)
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  PRIOR THERAPY: None  CURRENT THERAPY: Immunotherapy with Ketruda (pembrolizumab) 200 MG IV every 3 weeks. First dose 02/11/2017. Status post one cycle.    INTERVAL HISTORY:   Mr. Maino returns as scheduled. He completed cycle 1 Pembrolizumab 02/11/2017. He reports pruritus involving the upper body since the first dose of Pembrolizumab. He has not noticed a rash. He notes normal improvement with Benadryl and Pepcid. He denies diarrhea. He has stable dyspnea on exertion. For the past week he has been coughing up "blood clots". The blood clots are mostly dark. There was one episode of bright red blood. He has noted recent tenderness of the right calf. No swelling. No known injury. He denies fever.  Objective:  Vital signs in last 24 hours:  Blood pressure 110/68, pulse 92, temperature 97.8 F (36.6 C), temperature source Oral, resp. rate 18, height 6' 2.49" (1.892 m), weight 172 lb 1.6 oz (78.1 kg), SpO2 98 %.    HEENT: No thrush or ulcers. Resp: Distant breath sounds. Cardio: Regular rate and rhythm. GI: Abdomen soft and nontender. No hepatomegaly. Vascular: No leg edema. The right calf is tender. No palpable cord. No erythema. The right leg appears larger than the left leg below the knee. Neuro: Alert and oriented.  Skin: Bilateral forearms with erythema. Large patches of erythema on the back and lower chest/upper abdomen. Erythema at the upper chest.    Lab Results:  Lab Results  Component Value Date   WBC 20.7 (H) 03/04/2017   HGB 10.7 (L) 03/04/2017   HCT 33.5 (L) 03/04/2017   MCV 82.4 03/04/2017   PLT 557 (H) 03/04/2017   NEUTROABS 17.0 (H) 03/04/2017    Imaging:  No results found.  Medications: I have reviewed the patient's current medications.  Assessment/Plan: 1. Stage IV non-small cell lung cancer, squamous cell carcinoma presented with large left upper lobe lung mass in addition to mediastinal lymphadenopathy and right upper lobe nodule.  2. Squamous cell carcinoma of the right tonsil with cervical lymphadenopathy.  3. PDL 1 expression is 95%. 4. Rash/pruritus following cycle 1 Pembrolizumab. He will begin a Medrol Dosepak. 5. Right calf pain. Refer for venous Doppler. 6. Hemoptysis. Refer for CT angio chest.   Disposition: Mr. Ozment has completed 1 cycle of Pembrolizumab. He has developed a pruritic rash. We are holding today's treatment. He will begin a Medrol Dosepak.  He is experiencing right calf pain. On exam the right lower leg appears larger than the left lower leg. We are referring him for a venous Doppler to rule out DVT.  He reports a one-week history of hemoptysis at today's visit. We are referring him for a stat chest CT to rule out PE.  Next follow-up appointment is in 3 weeks. We will adjust accordingly pending results from the above studies.  Patient seen with Dr. Julien Nordmann.  Ned Card ANP/GNP-BC   03/04/2017  11:36 AM  Addendum: Chest CT negative for PE. Left upper lobe mass increased in size. Associated tumor thrombus extending into the left atrium. Right lower extremity venous Doppler negative for DVT. I reviewed these results with  Dr. Julien Nordmann and spoke with Mr. Gatchel's wife. Dr. Julien Nordmann recommends a referral to radiation oncology. Mr. Lowrimore is currently on aspirin 81 mg daily for "heart health". He will hold aspirin for 2 days to see if the hemoptysis improves.   ADDENDUM: Hematology/Oncology Attending: I had a face to face encounter with the patient today. I recommended his care plan. This is a  very pleasant 66 years old white male with a stage IV non-small cell lung cancer, squamous cell carcinoma presented with large left upper lobe lung mass in addition to mediastinal lymphadenopathy and right upper lobe nodule. His PDL 1 expression was 95% and the patient was started on treatment with Ketruda (pembrolizumab) status post 1 cycle. He presented today for evaluation before starting cycle #21 was complaining of increasing rash on the chest and back with itching. He was also complaining of swelling of the right lower extremity and cough with hemoptysis. I recommended for the patient to hold his treatment with [place] (pembrolizumab) for now. We arranged for the patient to have urgent CT angiogram of the chest that showed no evidence for pulmonary embolism but there was evidence for increasing size of the lung cancer which could be pseudo-progression after the first cycle of his treatment with [place] (pembrolizumab) but with the recent hemoptysis, I will refer the patient to radiation oncology for consideration of palliative radiotherapy to the large left upper lobe lung mass. For the pruritic skin rash, I will start the patient on Medrol Dosepak and will continue to monitor him closely to see if the patient would need higher doses of steroids. We also checked Doppler of the lower extremities that showed no evidence for the venous thrombosis. I will see the patient back for follow-up visit in 3 weeks for reevaluation before resuming his treatment with immunotherapy. He was advised to call immediately if he has any concerning symptoms in the interval.  Disclaimer: This note was dictated with voice recognition software. Similar sounding words can inadvertently be transcribed and may be missed upon review. Eilleen Kempf., MD 03/04/17

## 2017-03-06 NOTE — Progress Notes (Signed)
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  Tobacco/Marijuana/Snuff/ETOH use: has smoked 1 ppd for 50 years.  He quit in January.  Occasional ETOH use.  Past/Anticipated interventions by cardiothoracic surgery, if any: no  Past/Anticipated interventions by medical oncology, if any: Immunotherapy with Ketruda (pembrolizumab) 200 MG IV every 3 weeks. First dose 02/11/2017.Status post one cycle.    Signs/Symptoms  Weight changes, if any: yes - has lost 20 lbs in last 6 months - reports appetite is improving  Respiratory complaints, if any: shortness of breath with activity.  He reports he sometimes feels like there is something in his throat and tries to cough it up.  Hemoptysis, if any: yes 2 weeks ago  Pain issues, if any:  no  SAFETY ISSUES:  Prior radiation? no  Pacemaker/ICD? no   Possible current pregnancy?no  Is the patient on methotrexate? no  Current Complaints / other details:  Patient is here with his wife.  They will be moving to [place], [place] this week and would like to transfer his care there.  BP 120/73 (BP Location: Right Arm, Patient Position: Sitting)   Pulse 99   Temp 98.7 F (37.1 C) (Oral)   Ht 6' (1.829 m)   Wt 171 lb (77.6 kg)   SpO2 99%   BMI 23.19 kg/m    Wt Readings from Last 3 Encounters:  03/09/17 171 lb (77.6 kg)  03/04/17 172 lb 1.6 oz (78.1 kg)  02/18/17 174 lb 12.8 oz  (79.3 kg)

## 2017-03-09 ENCOUNTER — Encounter: Payer: Self-pay | Admitting: Radiation Oncology

## 2017-03-09 ENCOUNTER — Ambulatory Visit
Admission: RE | Admit: 2017-03-09 | Discharge: 2017-03-09 | Disposition: A | Discharge status: Home / Self Care | Payer: Medicare HMO | Source: Ambulatory Visit | Attending: Radiation Oncology | Admitting: Radiation Oncology

## 2017-03-09 ENCOUNTER — Telehealth: Payer: Self-pay | Admitting: *Deleted

## 2017-03-09 ENCOUNTER — Telehealth: Payer: Self-pay | Admitting: Oncology

## 2017-03-09 VITALS — BP 120/73 | HR 99 | Temp 98.7°F | Ht 72.0 in | Wt 171.0 lb

## 2017-03-09 DIAGNOSIS — Z801 Family history of malignant neoplasm of trachea, bronchus and lung: Secondary | ICD-10-CM | POA: Insufficient documentation

## 2017-03-09 DIAGNOSIS — F17211 Nicotine dependence, cigarettes, in remission: Secondary | ICD-10-CM | POA: Diagnosis not present

## 2017-03-09 DIAGNOSIS — Z87891 Personal history of nicotine dependence: Secondary | ICD-10-CM | POA: Insufficient documentation

## 2017-03-09 DIAGNOSIS — C3492 Malignant neoplasm of unspecified part of left bronchus or lung: Secondary | ICD-10-CM

## 2017-03-09 DIAGNOSIS — C3412 Malignant neoplasm of upper lobe, left bronchus or lung: Secondary | ICD-10-CM | POA: Diagnosis not present

## 2017-03-09 DIAGNOSIS — Z8709 Personal history of other diseases of the respiratory system: Secondary | ICD-10-CM | POA: Diagnosis not present

## 2017-03-09 DIAGNOSIS — Z9889 Other specified postprocedural states: Secondary | ICD-10-CM | POA: Insufficient documentation

## 2017-03-09 DIAGNOSIS — C3490 Malignant neoplasm of unspecified part of unspecified bronchus or lung: Secondary | ICD-10-CM | POA: Diagnosis not present

## 2017-03-09 DIAGNOSIS — C099 Malignant neoplasm of tonsil, unspecified: Secondary | ICD-10-CM | POA: Diagnosis not present

## 2017-03-09 DIAGNOSIS — Z79899 Other long term (current) drug therapy: Secondary | ICD-10-CM | POA: Insufficient documentation

## 2017-03-09 DIAGNOSIS — C770 Secondary and unspecified malignant neoplasm of lymph nodes of head, face and neck: Secondary | ICD-10-CM | POA: Diagnosis not present

## 2017-03-09 DIAGNOSIS — K1370 Unspecified lesions of oral mucosa: Secondary | ICD-10-CM | POA: Diagnosis not present

## 2017-03-09 NOTE — Telephone Encounter (Signed)
Left a message for Lawrence Ro, RN and Dr. Worthy Flank nurse to notify them that patient will be moving to [place], [place] this week and would like to transfer his care to [place].

## 2017-03-09 NOTE — Progress Notes (Signed)
Please see the Nurse Progress Note in the MD Initial Consult Encounter for this patient. 

## 2017-03-09 NOTE — Telephone Encounter (Signed)
Oncology Nurse Navigator Documentation  Oncology Nurse Navigator Flowsheets 03/09/2017  Navigator Location [facility]  Navigator Encounter Type Other/I received a message from Dr. Sondra Come that patient is moving to [facility].  I called patient to clarify.  Patient is moving this Thursday.  I called [facility].  I updated them on referral and was given an appt time. I called the patient back and updated his wife of appt time and place. I gave her the address and physicians name. I will fax records to Dr. Wyvonnia Lora office as requested. I will updated treatment team of above information. I will cancel appt's   Treatment Phase Treatment  Barriers/Navigation Needs Coordination of Care  Interventions Coordination of Care  Coordination of Care Other  Acuity Level 2  Acuity Level 2 Referrals such as genetics, survivorship;Other  Time Spent with Patient 45

## 2017-03-09 NOTE — Progress Notes (Addendum)
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  HISTORY OF PRESENT ILLNESS::Lawrence Weaver is a 66 y.o. male who presented to the ED on 11/28/16 with 8/10 left-sided chest pain. CT imaging of the chest revealed a new large left upper lobe mass. Patient underwent transbronchial needle aspiration left upper lobe brushing on 01/22/17. This showed malignant cells consistent with non-small cell carcinoma. Biopsy of the mass revealed squamous cell carcinoma. Patient was seen by Dr. Julien Nordmann and he was started on Immunotherapy with Ketruda (pembrolizumab) 200 mg IV every 3 weeks on 02/11/17. He is status post one cycle. Patient underwent CT angiography of the chest with contrast on 03/04/17. This showed a 10.7 cm left upper lobe mass, corresponding to the known primary bronchogenic neoplasm. There was bilateral pulmonary metastases measuring up to 2.5 cm in the anterior right upper lobe. There was additional thoracic nodal metastasis measuring up to 1.6 cm.  Patient reports he has lost 20 lbs in the past 6 months. He does note his appetite is improving. He notes shortness of breath with activity, and reports occasionally feeling like there is something in his throat. He reports hemoptysis 2 weeks ago. He denies pain at this time. No ear pain or difficulty swallowing foods or liquids.  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    PAST SURGICAL HISTORY: Past Surgical History:  Procedure Laterality Date  . ANKLE SURGERY Left    fracture repair  . COLON RESECTION  1970''s  . VIDEO BRONCHOSCOPY N/A 01/22/2017   Procedure: VIDEO BRONCHOSCOPY WITH FLUORO;  Surgeon: [name], MD;  Location: [facility] OR;  Service: Thoracic;  Laterality: N/A;    FAMILY HISTORY: family history includes Cancer in his mother; Lung cancer in his brother and sister. recently retired from full-time as a Development worker, community  SOCIAL HISTORY:  reports that he quit smoking about 3 months ago. His smoking use included Cigarettes. He has a 50.00 pack-year smoking history. He has never used smokeless tobacco. He reports that he drinks alcohol. He reports that he does not use drugs.  ALLERGIES: No known allergies  MEDICATIONS:  Current Outpatient Prescriptions  Medication Sig Dispense Refill  . diphenhydrAMINE (BENADRYL) 25 MG tablet Take 25-50 mg by mouth at bedtime as needed (1 - 2 tab).    . famotidine (PEPCID) 20 MG tablet Take 1 tablet (20 mg total) by mouth 2 (two) times daily. 60 tablet 1  . ibuprofen (ADVIL,MOTRIN) 200 MG tablet Take 200 mg by mouth every 6 (six) hours as needed.    . methylPREDNISolone (MEDROL DOSEPAK) 4 MG TBPK tablet Take as directed 21 tablet 0  . aspirin EC 81 MG tablet Take 81 mg by mouth daily.    Marland Kitchen OVER THE COUNTER MEDICATION     . prochlorperazine (COMPAZINE) 10 MG tablet Take 1 tablet (10 mg total) by mouth every 6 (six) hours as needed for nausea or vomiting. (Patient not taking: Reported on 03/04/2017) 30 tablet  0   No current facility-administered medications for this encounter.     REVIEW OF SYSTEMS: REVIEW OF SYSTEMS: A 10+ POINT REVIEW OF SYSTEMS WAS OBTAINED including neurology, dermatology, psychiatry, cardiac, respiratory, lymph, extremities, GI, GU, musculoskeletal, constitutional,  reproductive, HEENT. All pertinent positives are noted in the HPI. All others are negative.      PHYSICAL EXAM:  height is 6' (1.829 m) and weight is 171 lb (77.6 kg). His oral temperature is 98.7 F (37.1 C). His blood pressure is 120/73 and his pulse is 99. His oxygen saturation is 99%.   General: Alert and oriented, in no acute distress HEENT: Head is normocephalic. Extraocular movements are intact. Oropharynx is clear. Oral exam the patient has poor dentition with several teeth missing. He has a suspicious lesion in the right tonsillar area although difficult to see because of the patient's significant gag reflex. The lesion was attempted to be palpated, but due to the patient's gag reflex, I could not get a good evaluation. Neck: Neck is supple, no palpable cervical or supraclavicular lymphadenopathy. Heart: Regular in rate and rhythm with no murmurs, rubs, or gallops. Chest: Clear to auscultation bilaterally, with no rhonchi, wheezes, or rales. Abdomen: Soft, nontender, nondistended, with no rigidity or guarding. Extremities: No cyanosis or edema. Lymphatics: see Neck Exam Skin: No concerning lesions. Musculoskeletal: symmetric strength and muscle tone throughout. Neurologic: Cranial nerves II through XII are grossly intact. No obvious focalities. Speech is fluent. Coordination is intact. Psychiatric: Judgment and insight are intact. Affect is appropriate.   ECOG = 1  LABORATORY DATA:  Lab Results  Component Value Date   WBC 20.7 (H) 03/04/2017   HGB 10.7 (L) 03/04/2017   HCT 33.5 (L) 03/04/2017   MCV 82.4 03/04/2017   PLT 557 (H) 03/04/2017   NEUTROABS 17.0 (H) 03/04/2017   Lab Results  Component Value Date   NA 136 03/04/2017   K 3.9 03/04/2017   CL 99 (L) 01/22/2017   CO2 25 03/04/2017   GLUCOSE 118 03/04/2017   CREATININE 0.8 03/04/2017   CALCIUM 9.1 03/04/2017      RADIOGRAPHY: Ct Angio Chest Pe W Or Wo Contrast  Result Date: 03/04/2017 CLINICAL DATA:  Lung  cancer, hemoptysis, cough EXAM: CT ANGIOGRAPHY CHEST WITH CONTRAST TECHNIQUE: Multidetector CT imaging of the chest was performed using the standard protocol during bolus administration of intravenous contrast. Multiplanar CT image reconstructions and MIPs were obtained to evaluate the vascular anatomy. CONTRAST:  80 mL Isovue 370 IV COMPARISON:  Chest radiographs dated 01/22/2017. PET-CT dated 01/21/2017. CT chest dated 01/06/2017. FINDINGS: Cardiovascular: Satisfactory opacification of the bilateral pulmonary arteries to the segmental level. No evidence of pulmonary embolism. The heart is normal in size.  Trace pericardial effusion. Three vessel coronary atherosclerosis. No evidence of thoracic aortic aneurysm. Atherosclerotic calcifications of the aortic arch. Mediastinum/Nodes: Thoracic lymphadenopathy, mildly progressed from prior CT, including: --8 mm short axis left supraclavicular node (series 4/ image 11) --14 mm short axis AP window node (series 4/ image 45) --14 mm short axis subcarinal node (series 4/image 54) --16 mm short axis right hilar node (series 4/image 54) Visualized thyroid is unremarkable. Lungs/Pleura: 10.7 x 10.1 cm left upper lobe mass, corresponding to known primary bronchogenic neoplasm, although superimposed postobstructive opacity is possible. This previously measured 8 x 6 mm on PET. The mass extends into the left hilar region. Tumor thrombus extends into the left atrium via the left superior pulmonary vein (coronal image 36). This has progressed from prior CT. Bilateral pulmonary nodules/  metastases, including a dominant 2.5 cm anterior right upper lobe nodule (series 6/image 52), previously 2.0 cm. Small left pleural effusion, increased.  No pneumothorax. Upper Abdomen: Visualized upper abdomen is unremarkable. Musculoskeletal: Mild degenerative changes of the mid thoracic spine. Review of the MIP images confirms the above findings. IMPRESSION: No evidence of pulmonary embolism. 10.7  cm left upper lobe mass, corresponding to known primary bronchogenic neoplasm, increased. Associated tumor thrombus extending into the left atrium via the left superior pulmonary vein. Bilateral pulmonary nodules/metastases, measuring up to 2.5 cm in the anterior right upper lobe, increased. Thoracic nodal metastases, measuring up to 1.6 cm short axis, mildly increased. Small left pleural effusion, increased. Electronically Signed   By: Julian Hy M.D.   On: 03/04/2017 12:54      IMPRESSION: Stage IV (T3, N3, M1a) non-small cell lung cancer, squamous cell carcinoma; possible synchronize tonsillar cancer with PET scan showing right upper neck nodal metastasis.  Patient has had recent episodes of hemoptysis and PET scan shows postobstructive pneumonitis in the left upper lobe. He would likely benefit from palliative radiation therapy to the left lung mass. Patient informed us today he will be moving to [place], [place] on 03/12/17 so he will not proceed with treatment and planning in [place].  PLAN: The patient's information will be referred to the [facility] and Hematology associates. The patient will then be referred to radiation oncology from that group for consideration of palliative chest radiation therapy in [place], [place] area.     ------------------------------------------------  Blair Promise, PhD, MD  This document serves as a record of services personally performed by Gery Pray, MD. It was created on his behalf by Bethann Humble, a trained medical scribe. The creation of this record is based on the scribe's personal observations and the provider's statements to them. This document has been checked and approved by the attending provider.

## 2017-03-25 ENCOUNTER — Ambulatory Visit: Payer: Medicare HMO | Admitting: Internal Medicine

## 2017-03-25 ENCOUNTER — Other Ambulatory Visit: Payer: Medicare HMO

## 2017-03-25 ENCOUNTER — Encounter: Payer: Medicare HMO | Admitting: Nutrition

## 2017-03-25 ENCOUNTER — Ambulatory Visit: Payer: Medicare HMO

## 2017-04-15 ENCOUNTER — Other Ambulatory Visit: Payer: Medicare HMO

## 2017-04-15 ENCOUNTER — Ambulatory Visit: Payer: Medicare HMO

## 2017-04-15 ENCOUNTER — Ambulatory Visit: Payer: Medicare HMO | Admitting: Internal Medicine

## 2017-05-06 ENCOUNTER — Other Ambulatory Visit: Payer: Medicare HMO

## 2017-05-06 ENCOUNTER — Ambulatory Visit: Payer: Medicare HMO | Admitting: Internal Medicine

## 2017-05-06 ENCOUNTER — Ambulatory Visit: Payer: Medicare HMO

## 2018-01-18 DEATH — deceased

## 2018-03-04 IMAGING — CR DG CHEST 2V
2 series · 2 of 2 positions shown · non-contrast
Comparison: PET-CT 01/21/2017

CLINICAL DATA: Left lung mass

EXAM:
CHEST  2 VIEW

[w chest pa]
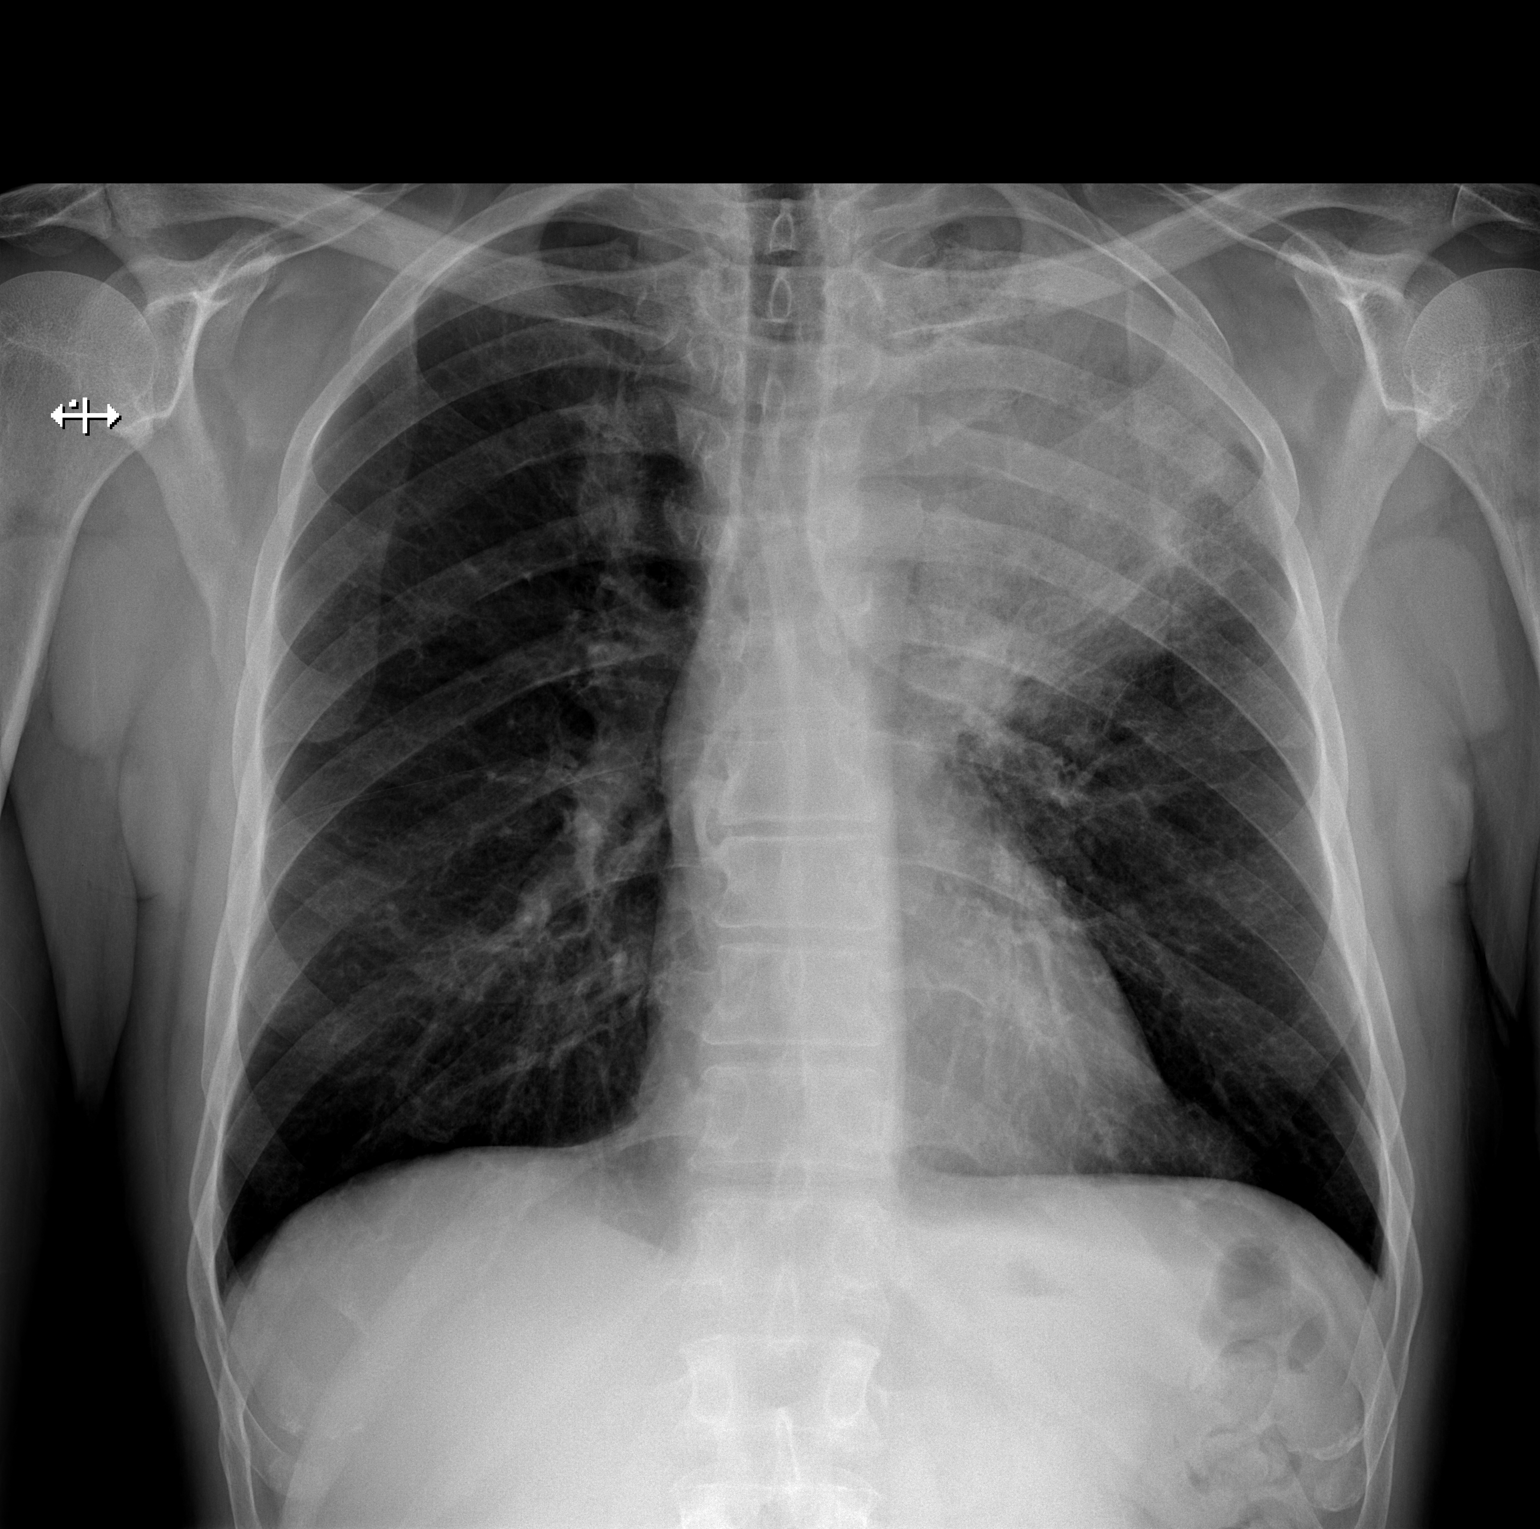

[w chest lat]
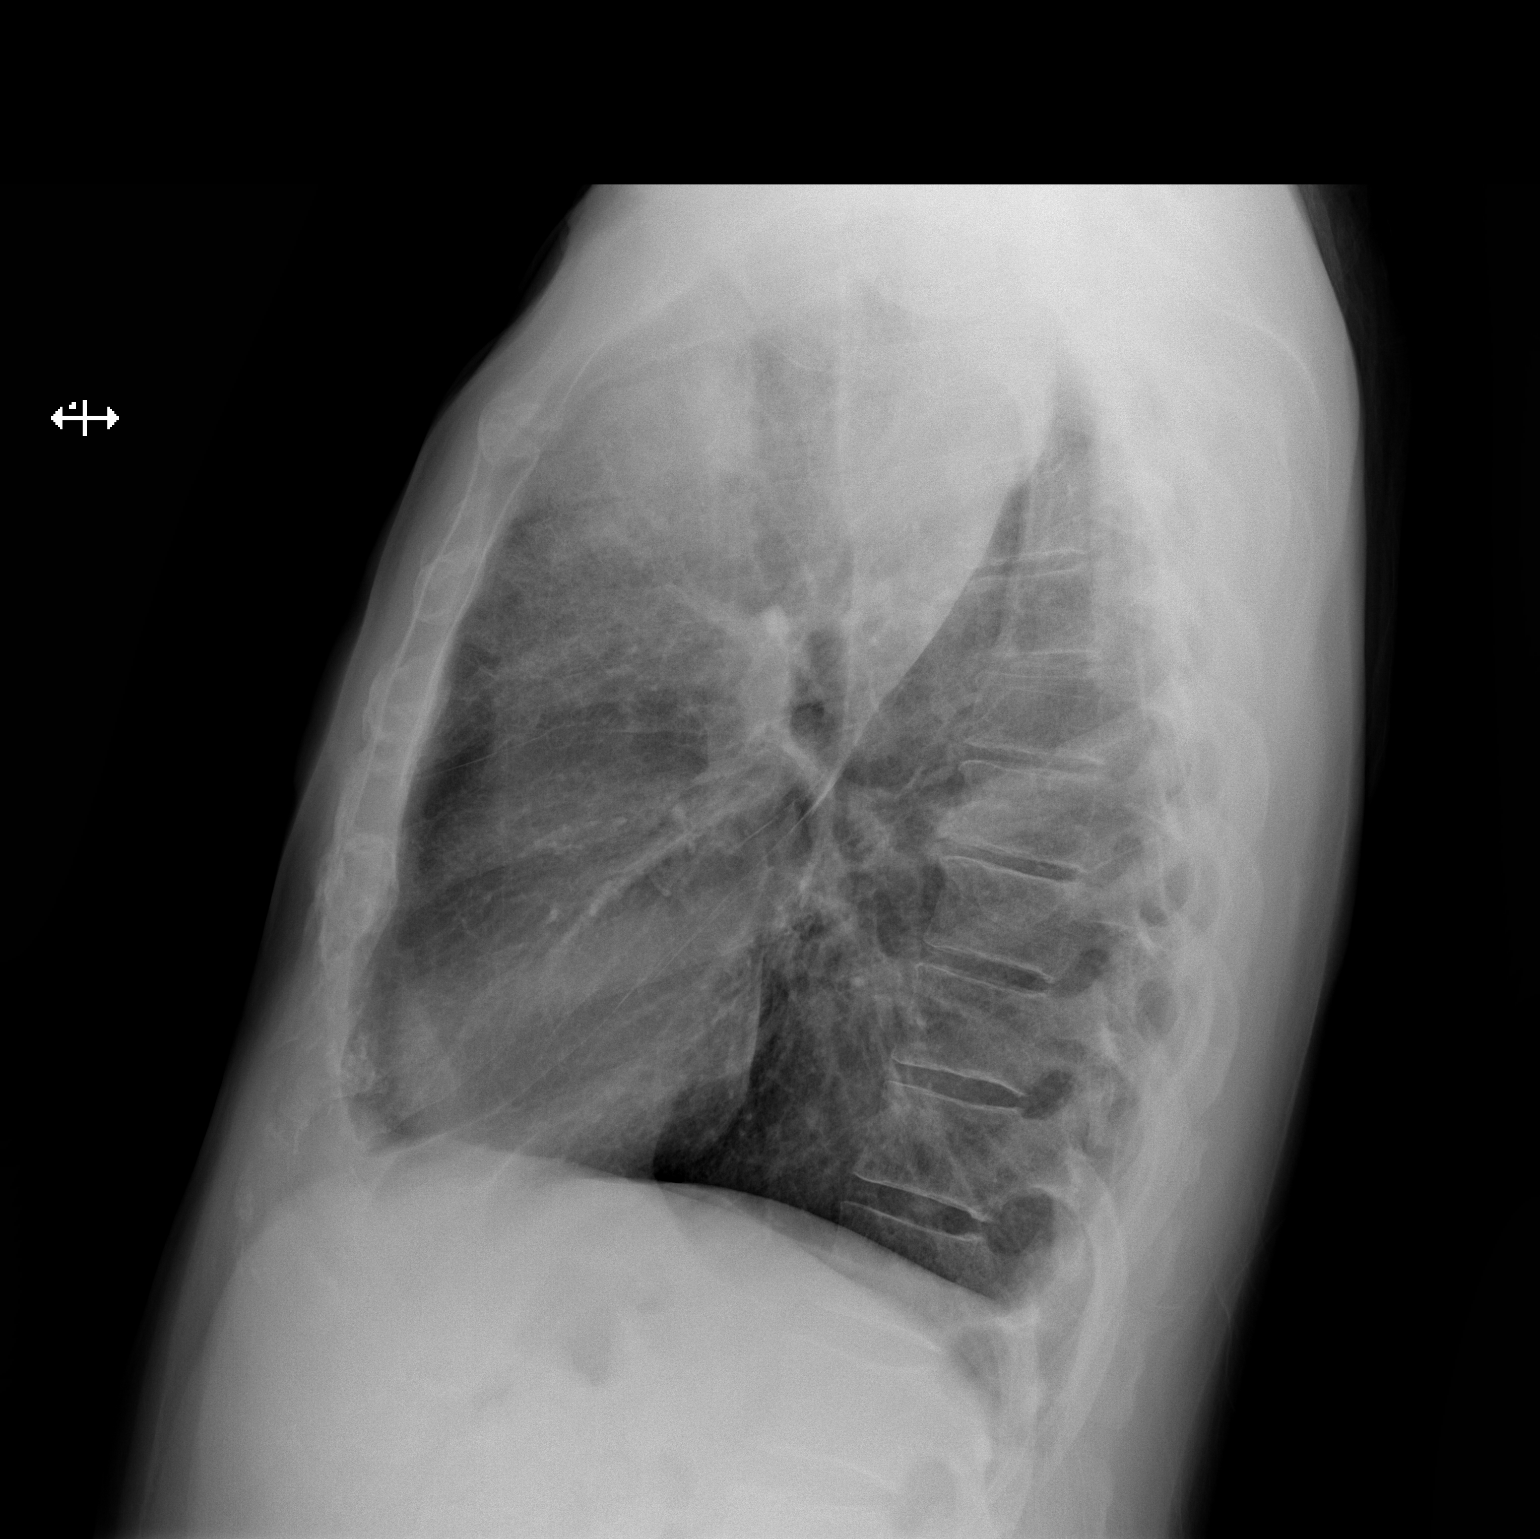

[2 of 2 positions shown; findings below may reference images not displayed]

FINDINGS: Left upper lobe mass and consolidation appear unchanged. Spiculated
right upper lobe mass is also visible superimposed on the first and
second anterior rib ends. Lung bases are clear. No pleural
effusions.
IMPRESSION: Bilateral upper lobe lung masses without significant change from the
recent cross-sectional imaging studies.

## 2018-04-14 IMAGING — CT CT ANGIO CHEST
2 of 6 series · 18 of 46 positions shown · IV contrast (APPLIED)
Comparison: Chest radiographs dated 01/22/2017. PET-CT dated
01/21/2017. CT chest dated 01/06/2017.

CLINICAL DATA: Lung cancer, hemoptysis, cough

EXAM:
CT ANGIOGRAPHY CHEST WITH CONTRAST
TECHNIQUE: Multidetector CT imaging of the chest was performed using the
standard protocol during bolus administration of intravenous
contrast. Multiplanar CT image reconstructions and MIPs were
obtained to evaluate the vascular anatomy.
CONTRAST:  80 mL Isovue 370 IV

[Series 5: thins · axial · 0.70mm/px · z∈[-348,-36]mm · 16 of 342 slices shown]
[im 15/342  lung]
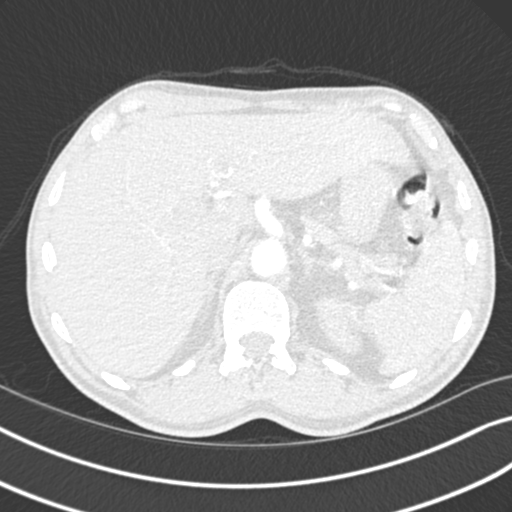
[im 45/342  soft-tissue]
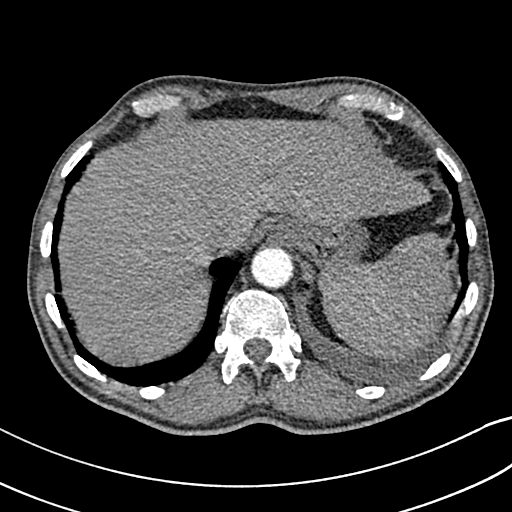
[im 60/342  lung]
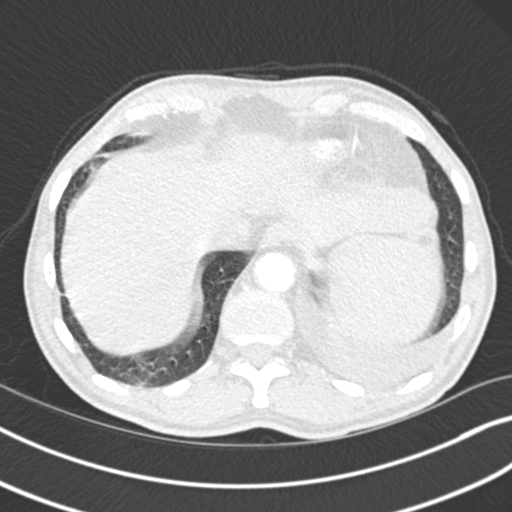
[im 75/342  soft-tissue]
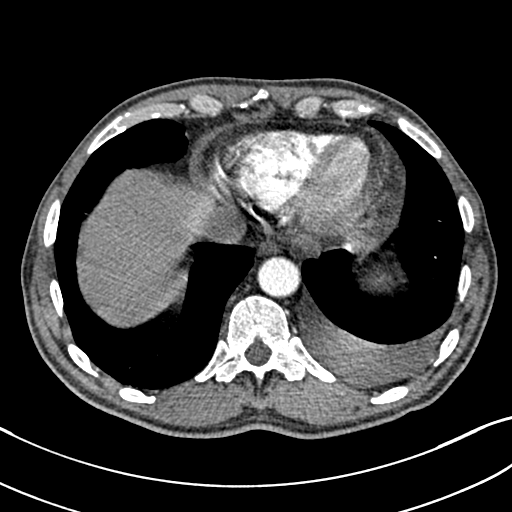
[im 104/342  lung]
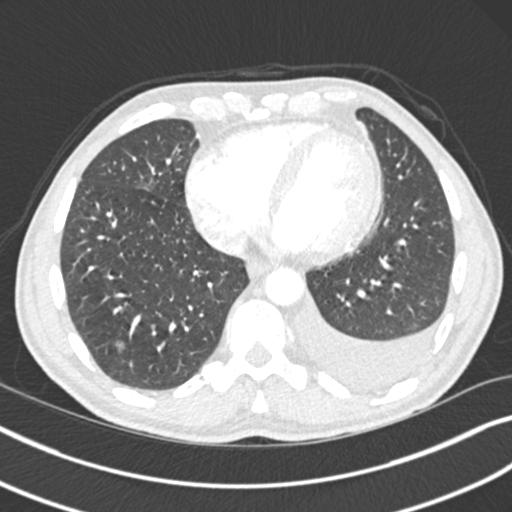
[im 119/342  soft-tissue]
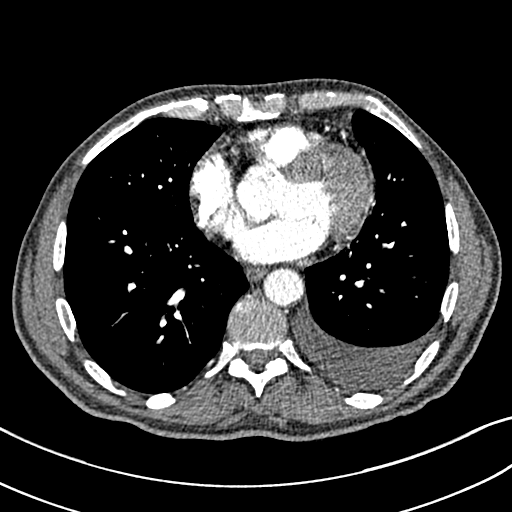
[im 134/342  lung]
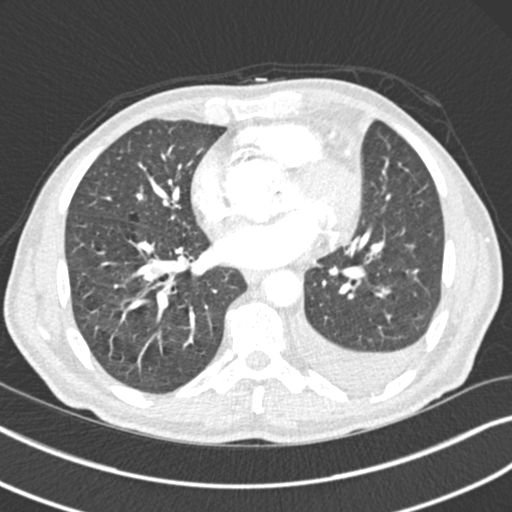
[im 164/342  soft-tissue]
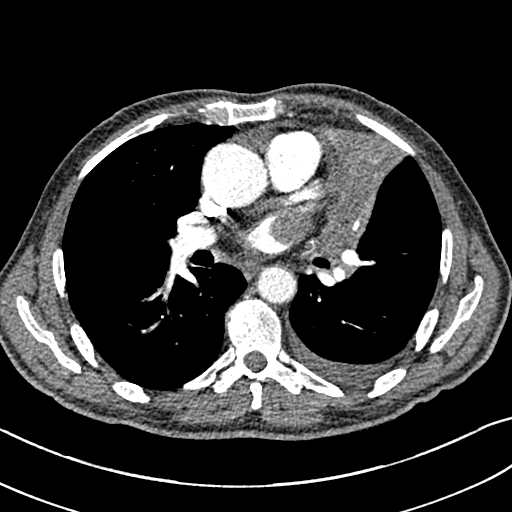
[im 178/342  lung]
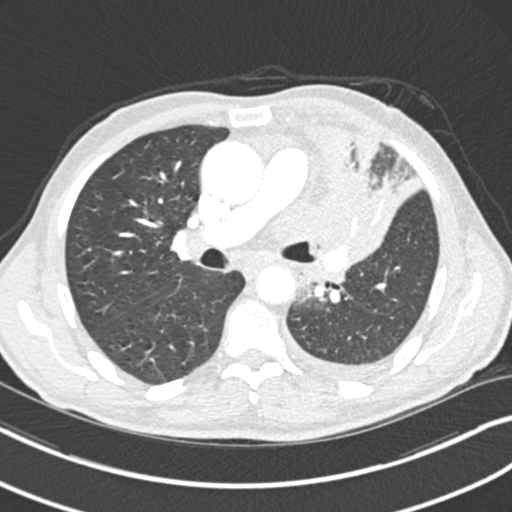
[im 208/342  soft-tissue]
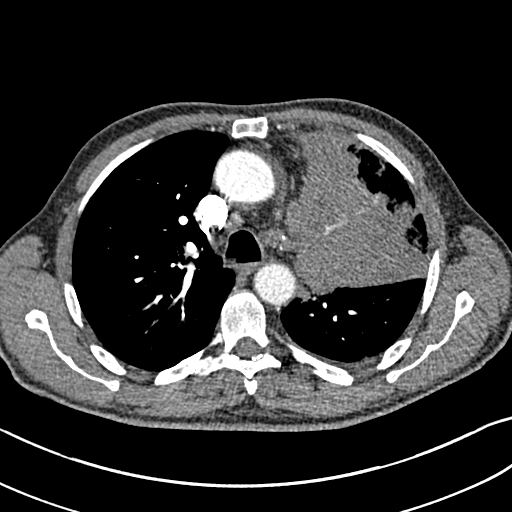
[im 223/342  lung]
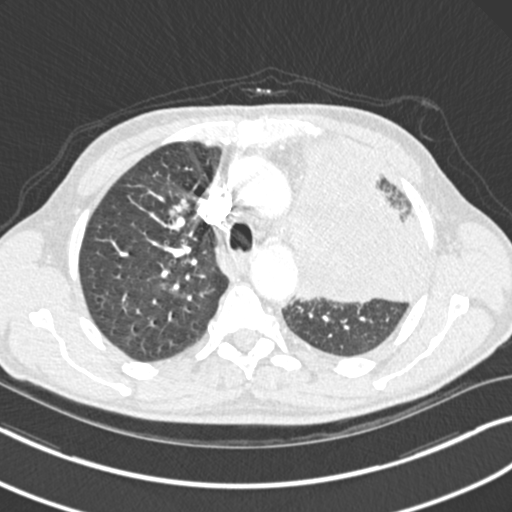
[im 238/342  soft-tissue]
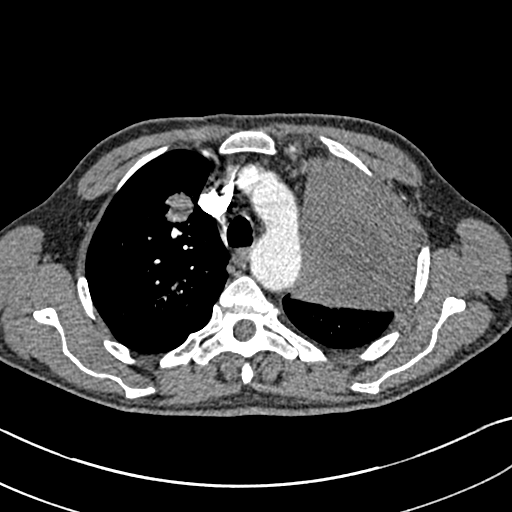
[im 267/342  lung]
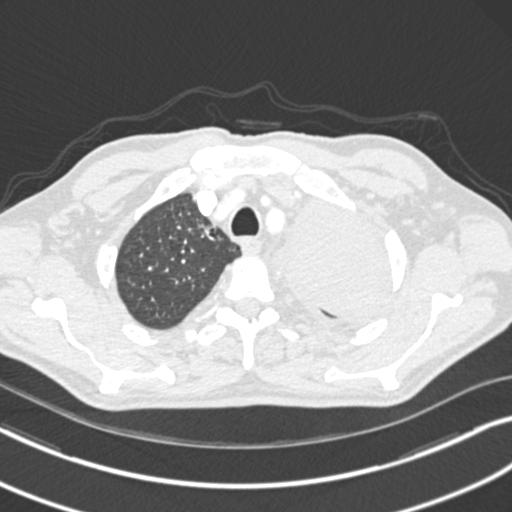
[im 282/342  soft-tissue]
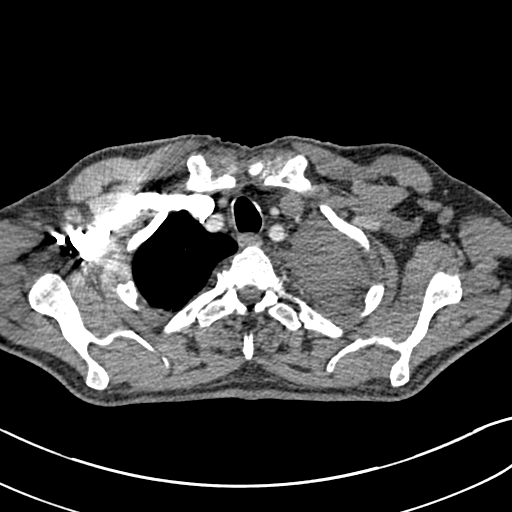
[im 297/342  lung]
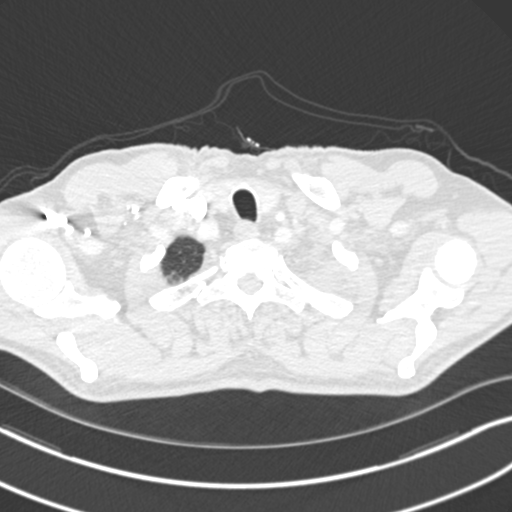
[im 327/342  soft-tissue]
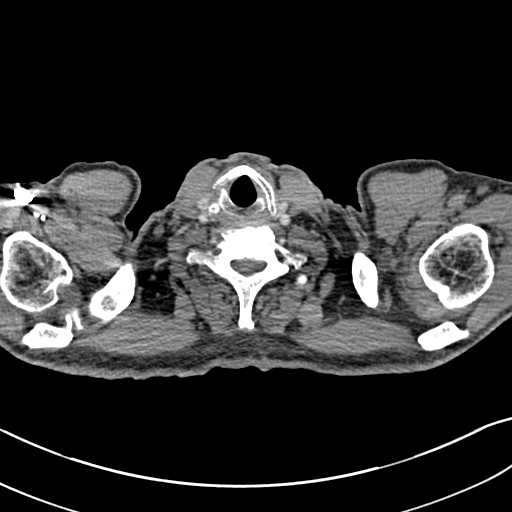

[Series 7: coronal mpr · coronal · 0.70mm/px · 2 of 87 slices shown]
[im 29/87  soft-tissue]
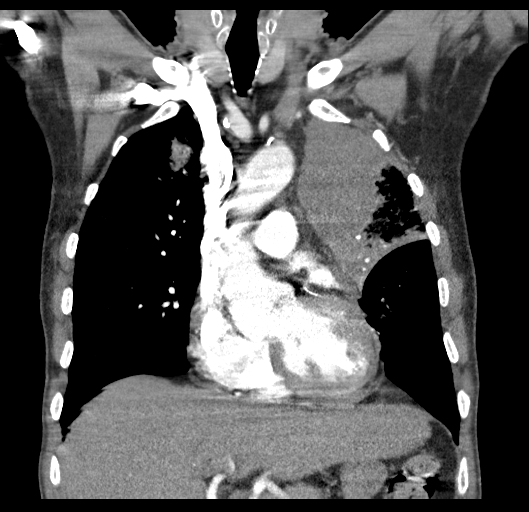
[im 58/87  soft-tissue]
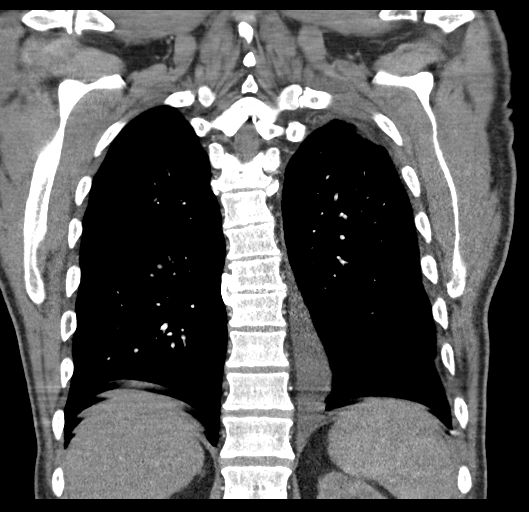

[18 of 46 positions shown; findings below may reference images not displayed]

FINDINGS: Cardiovascular: Satisfactory opacification of the bilateral
pulmonary arteries to the segmental level. No evidence of pulmonary
embolism.

The heart is normal in size.  Trace pericardial effusion.

Three vessel coronary atherosclerosis.

No evidence of thoracic aortic aneurysm. Atherosclerotic
calcifications of the aortic arch.

Mediastinum/Nodes: Thoracic lymphadenopathy, mildly progressed from
prior CT, including:

--8 mm short axis left supraclavicular node (series 4/ image 11)

--14 mm short axis AP window node (series 4/ image 45)

--14 mm short axis subcarinal node (series 4/image 54)

--16 mm short axis right hilar node (series 4/image 54)

Visualized thyroid is unremarkable.

Lungs/Pleura: 10.7 x 10.1 cm left upper lobe mass, corresponding to
known primary bronchogenic neoplasm, although superimposed
postobstructive opacity is possible. This previously measured 8 x 6
mm on PET. The mass extends into the left hilar region.

Tumor thrombus extends into the left atrium via the left superior
pulmonary vein (coronal image 36). This has progressed from prior
CT.

Bilateral pulmonary nodules/ metastases, including a dominant 2.5 cm
anterior right upper lobe nodule (series 6/image 52), previously
cm.

Small left pleural effusion, increased.  No pneumothorax.

Upper Abdomen: Visualized upper abdomen is unremarkable.

Musculoskeletal: Mild degenerative changes of the mid thoracic
spine.

Review of the MIP images confirms the above findings.
IMPRESSION: No evidence of pulmonary embolism.

10.7 cm left upper lobe mass, corresponding to known primary
bronchogenic neoplasm, increased. Associated tumor thrombus
extending into the left atrium via the left superior pulmonary vein.

Bilateral pulmonary nodules/metastases, measuring up to 2.5 cm in
the anterior right upper lobe, increased.

Thoracic nodal metastases, measuring up to 1.6 cm short axis, mildly
increased.

Small left pleural effusion, increased.

## 2023-08-20 ENCOUNTER — Encounter: Payer: Self-pay | Admitting: Radiation Oncology
